# Patient Record
Sex: Female | Born: 2000 | Race: White | Hispanic: No | Marital: Single | Smoking: Never smoker
Health system: Southern US, Community
[De-identification: ages and names within clinical notes are randomized; demographics above are authoritative.]

## PROBLEM LIST (undated history)

## (undated) ENCOUNTER — Ambulatory Visit: Source: Home / Self Care

## (undated) ENCOUNTER — Ambulatory Visit: Admission: EM | Payer: Managed Care, Other (non HMO)

## (undated) DIAGNOSIS — Z23 Encounter for immunization: Secondary | ICD-10-CM

## (undated) DIAGNOSIS — Z889 Allergy status to unspecified drugs, medicaments and biological substances status: Secondary | ICD-10-CM

## (undated) DIAGNOSIS — R51 Headache: Secondary | ICD-10-CM

## (undated) DIAGNOSIS — S42309A Unspecified fracture of shaft of humerus, unspecified arm, initial encounter for closed fracture: Secondary | ICD-10-CM

## (undated) DIAGNOSIS — J45909 Unspecified asthma, uncomplicated: Secondary | ICD-10-CM

## (undated) DIAGNOSIS — R519 Headache, unspecified: Secondary | ICD-10-CM

## (undated) HISTORY — DX: Headache: R51

## (undated) HISTORY — DX: Headache, unspecified: R51.9

## (undated) HISTORY — DX: Encounter for immunization: Z23

## (undated) HISTORY — PX: UPPER GI ENDOSCOPY: SHX6162

---
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
## 2020-01-04 ENCOUNTER — Encounter: Payer: Self-pay | Admitting: Obstetrics and Gynecology

## 2020-03-12 ENCOUNTER — Ambulatory Visit (INDEPENDENT_AMBULATORY_CARE_PROVIDER_SITE_OTHER): Payer: Managed Care, Other (non HMO)

## 2020-03-12 ENCOUNTER — Other Ambulatory Visit: Payer: Self-pay

## 2020-03-12 DIAGNOSIS — Z3042 Encounter for surveillance of injectable contraceptive: Secondary | ICD-10-CM

## 2020-03-12 MED ORDER — MEDROXYPROGESTERONE ACETATE 150 MG/ML IM SUSP
150.0000 mg | Freq: Once | INTRAMUSCULAR | Status: AC
  Administered 2020-03-12: 150 mg via INTRAMUSCULAR

## 2020-03-12 NOTE — Progress Notes (Signed)
Pt here for depo which was given IM right glut.  NDC# [address]-2 

## 2020-06-04 ENCOUNTER — Other Ambulatory Visit: Payer: Self-pay

## 2020-06-04 ENCOUNTER — Ambulatory Visit (INDEPENDENT_AMBULATORY_CARE_PROVIDER_SITE_OTHER): Payer: Managed Care, Other (non HMO)

## 2020-06-04 DIAGNOSIS — Z3042 Encounter for surveillance of injectable contraceptive: Secondary | ICD-10-CM

## 2020-06-04 MED ORDER — MEDROXYPROGESTERONE ACETATE 150 MG/ML IM SUSP
150.0000 mg | Freq: Once | INTRAMUSCULAR | Status: AC
  Administered 2020-06-04: 150 mg via INTRAMUSCULAR

## 2020-06-04 NOTE — Progress Notes (Signed)
Pt here for depo which was given IM right glut.  NDC# [address]-2 

## 2020-06-12 ENCOUNTER — Ambulatory Visit: Payer: Managed Care, Other (non HMO) | Admitting: Obstetrics and Gynecology

## 2020-06-26 ENCOUNTER — Encounter: Payer: Self-pay | Admitting: Obstetrics and Gynecology

## 2020-06-26 ENCOUNTER — Ambulatory Visit (INDEPENDENT_AMBULATORY_CARE_PROVIDER_SITE_OTHER): Payer: Managed Care, Other (non HMO) | Admitting: Obstetrics and Gynecology

## 2020-06-26 ENCOUNTER — Other Ambulatory Visit (HOSPITAL_COMMUNITY)
Admission: RE | Admit: 2020-06-26 | Discharge: 2020-06-26 | Disposition: A | Discharge status: Home / Self Care | Payer: Managed Care, Other (non HMO) | Source: Ambulatory Visit | Attending: Obstetrics and Gynecology | Admitting: Obstetrics and Gynecology

## 2020-06-26 ENCOUNTER — Other Ambulatory Visit: Payer: Self-pay

## 2020-06-26 VITALS — BP 100/60 | Ht 60.0 in | Wt 118.0 lb

## 2020-06-26 DIAGNOSIS — N921 Excessive and frequent menstruation with irregular cycle: Secondary | ICD-10-CM | POA: Diagnosis not present

## 2020-06-26 DIAGNOSIS — Z113 Encounter for screening for infections with a predominantly sexual mode of transmission: Secondary | ICD-10-CM

## 2020-06-26 DIAGNOSIS — Z803 Family history of malignant neoplasm of breast: Secondary | ICD-10-CM

## 2020-06-26 DIAGNOSIS — Z3042 Encounter for surveillance of injectable contraceptive: Secondary | ICD-10-CM | POA: Diagnosis not present

## 2020-06-26 DIAGNOSIS — Z01419 Encounter for gynecological examination (general) (routine) without abnormal findings: Secondary | ICD-10-CM

## 2020-06-26 DIAGNOSIS — N941 Unspecified dyspareunia: Secondary | ICD-10-CM

## 2020-06-26 DIAGNOSIS — D229 Melanocytic nevi, unspecified: Secondary | ICD-10-CM

## 2020-06-26 MED ORDER — MEDROXYPROGESTERONE ACETATE 150 MG/ML IM SUSY: PREFILLED_SYRINGE | INTRAMUSCULAR | 0 refills | Status: DC

## 2020-06-26 NOTE — Progress Notes (Signed)
PCP:  Wayland Denis, PA-C   Chief Complaint  Patient presents with  . Gynecologic Exam    still having pain during intercourse     HPI:      [name] is a 19 y.o. G0P0000 who LMP was Patient's last menstrual period was 05/25/2020 (exact date)., presents today for her annual examination.  Her menses are irregular with depo, lasting a couple wks every month or so. Has mild dysmen with bleeding. Changed from OCPs 6/20 due to dyspareunia and pelvic pain. Didn't have cycle control with OCPs in past either. Dysmenorrhea mild, occurring first 1-2 days of flow. She does have intermenstrual bleeding.  Sex activity: single partner, contraception -depo. Dyspareunia (achy, sharp, crampy pains) not improved with depo; pain stops once sex is over. Had neg GYN u/s 1/21, question GI due to constipation at the time. Pt tried fiber, etc with decreased constipation, but still with dyspareunia. Sometimes has pain/pressure with BMs.   Last Pap: N/A Hx of STDs: none  There is a FH of breast cancer in her MGGM and  PGM in her 59s, per pt's mom. Details not known. Genetic testing not done. There is no FH of ovarian cancer. The patient does not do self-breast exams.  Tobacco use: The patient denies current or previous tobacco use. Alcohol use: none No drug use.  Exercise: min active  She does get adequate calcium and Vitamin D in her diet. Gardasil completed. Pt with mole on RT breast that changed about 2 yrs ago. No changes since dark spot in center appeared. Hasn't seen derm.   Past Medical History:  Diagnosis Date  . Broken arm   . Headache   . Vaccine for human papilloma virus (HPV) types 6, 11, 16, and 18 administered     Past Surgical History:  Procedure Laterality Date  . OTHER SURGICAL HISTORY Right 10/2011   Right thumb    Family History  Problem Relation Age of Onset  . Migraines Maternal Uncle        Had migraines as a teen  . ADD / ADHD Maternal Uncle   . Migraines  Maternal Grandmother   . Depression Maternal Grandmother   . Anxiety disorder Maternal Grandmother   . Diabetes Maternal Grandmother   . Hypertension Maternal Grandmother   . Breast cancer Paternal Grandmother        45s  . Breast cancer Other 60  . Stomach cancer Other   . Colon cancer Other     Social History   Socioeconomic History  . Marital status: Single    Spouse name: Not on file  . Number of children: Not on file  . Years of education: Not on file  . Highest education level: Not on file  Occupational History  . Not on file  Tobacco Use  . Smoking status: Never Smoker  . Smokeless tobacco: Never Used  Vaping Use  . Vaping Use: Never used  Substance and Sexual Activity  . Alcohol use: No  . Drug use: No  . Sexual activity: Yes    Birth control/protection: Injection  Other Topics Concern  . Not on file  Social History Narrative   Celenia is in ninth grade at [employer]. She is doing average this school year.   Living with her mother.   Social Determinants of Health   Financial Resource Strain:   . Difficulty of Paying Living Expenses:   Food Insecurity:   . Worried About [employer] of  Food in the Last Year:   . [place] in the Last Year:   Transportation Needs:   . Film/video editor (Medical):   Marland Kitchen Lack of Transportation (Non-Medical):   Physical Activity:   . Days of Exercise per Week:   . Minutes of Exercise per Session:   Stress:   . Feeling of Stress :   Social Connections:   . Frequency of Communication with Friends and Family:   . Frequency of Social Gatherings with Friends and Family:   . Attends Religious Services:   . Active Member of Clubs or Organizations:   . Attends Archivist Meetings:   Marland Kitchen Marital Status:   Intimate Partner Violence:   . Fear of Current or Ex-Partner:   . Emotionally Abused:   Marland Kitchen Physically Abused:   . Sexually Abused:     Outpatient Medications Prior to Visit  Medication  Sig Dispense Refill  . Calcium-Phosphorus-Vitamin D (CALCIUM/D3 ADULT GUMMIES) 200-96.6-200 MG-MG-UNIT CHEW Chew by mouth.    . cetirizine (ZYRTEC) 10 MG tablet Take 10 mg by mouth daily.    Marland Kitchen EPINEPHrine 0.3 mg/0.3 mL IJ SOAJ injection INJ 1 SYRINGE IM ONCE PRF ANAPHYLAXIS    . fexofenadine (ALLEGRA) 180 MG tablet TK 1 T PO QD  2  . fluticasone (FLONASE) 50 MCG/ACT nasal spray Place 2 sprays into both nostrils every morning.    . montelukast (SINGULAIR) 10 MG tablet     . medroxyPROGESTERone Acetate 150 MG/ML SUSY INJECT 1 ML IN THE MUSCLE ONCE FOR 1 DOSE 1 mL 2  . albuterol (VENTOLIN HFA) 108 (90 Base) MCG/ACT inhaler Inhale into the lungs.    . Multiple Vitamins-Minerals (MULTIVITAMIN PO) Take 1 Dose by mouth every morning.      No facility-administered medications prior to visit.      ROS:  Review of Systems  Constitutional: Negative for fatigue, fever and unexpected weight change.  Respiratory: Negative for cough, shortness of breath and wheezing.   Cardiovascular: Negative for chest pain, palpitations and leg swelling.  Gastrointestinal: Negative for blood in stool, constipation, diarrhea, nausea and vomiting.  Endocrine: Negative for cold intolerance, heat intolerance and polyuria.  Genitourinary: Positive for dyspareunia and menstrual problem. Negative for dysuria, flank pain, frequency, genital sores, hematuria, pelvic pain, urgency, vaginal bleeding, vaginal discharge and vaginal pain.  Musculoskeletal: Negative for back pain, joint swelling and myalgias.  Skin: Negative for rash.  Neurological: Negative for dizziness, syncope, light-headedness, numbness and headaches.  Hematological: Negative for adenopathy.  Psychiatric/Behavioral: Negative for agitation, confusion, sleep disturbance and suicidal ideas. The patient is not nervous/anxious.   BREAST: No symptoms   Objective: BP 100/60   Ht 5' (1.524 m)   Wt 118 lb (53.5 kg)   LMP 05/25/2020 (Exact Date)   BMI 23.05  kg/m    Physical Exam Constitutional:      Appearance: She is well-developed.  Genitourinary:     Vulva, vagina, cervix, uterus, right adnexa and left adnexa normal.     No vulval lesion or tenderness noted.     No vaginal discharge, erythema or tenderness.     No cervical polyp.     Uterus is not enlarged or tender.     No right or left adnexal mass present.     Right adnexa not tender.     Left adnexa not tender.  Neck:     Thyroid: No thyromegaly.  Cardiovascular:     Rate and Rhythm: Normal rate and regular rhythm.  Heart sounds: Normal heart sounds. No murmur heard.   Pulmonary:     Effort: Pulmonary effort is normal.     Breath sounds: Normal breath sounds.  Chest:     Breasts:        Right: No mass, nipple discharge, skin change or tenderness.        Left: No mass, nipple discharge, skin change or tenderness.    Abdominal:     Palpations: Abdomen is soft.     Tenderness: There is no abdominal tenderness. There is no guarding or rebound.  Musculoskeletal:        General: Normal range of motion.     Cervical back: Normal range of motion.  Neurological:     General: No focal deficit present.     Mental Status: She is alert and oriented to person, place, and time.     Cranial Nerves: No cranial nerve deficit.  Skin:    General: Skin is warm and dry.  Psychiatric:        Mood and Affect: Mood normal.        Behavior: Behavior normal.        Thought Content: Thought content normal.        Judgment: Judgment normal.  Vitals reviewed.     Assessment/Plan: Encounter for annual routine gynecological examination  Screening for STD (sexually transmitted disease) - Plan: Cervicovaginal ancillary only  Encounter for surveillance of injectable contraceptive - Plan: medroxyPROGESTERone Acetate 150 MG/ML SUSY; Depo RF until pt sees MD for sx/dx lap consult.   Breakthrough bleeding on depo provera--eval dyspareunia first and then determine tx. May change to  different tx.  Dyspareunia in female--no relief with OCPs/depo. Neg GYN u/s 1/21. Refer to MD for further eval, dx lap consult.   Atypical mole--RT breast. Pt to sched mole check with derm (doesn't need referral).   Family history of breast cancer--will discuss MyRisk testing age 71.   Meds ordered this encounter  Medications  . medroxyPROGESTERone Acetate 150 MG/ML SUSY    Sig: INJECT 1 ML IN THE MUSCLE ONCE FOR 1 DOSE    Dispense:  1 mL    Refill:  0    Order Specific Question:   Supervising Provider    Answer:   Gae Dry [[id number]]             GYN counsel  adequate intake of calcium and vitamin D, diet and exercise     F/U  Return in about 1 week (around 07/03/2020) for with MD for CPP/dx lap consult.  Jonathan Kirkendoll B. Neesa Knapik, PA-C 06/26/2020 4:07 PM

## 2020-06-26 NOTE — Patient Instructions (Signed)
I value your feedback and entrusting us with your care. If you get a [place] patient survey, I would appreciate you taking the time to let us know about your experience today. Thank you!  As of November 29, 2019, your lab results will be released to your MyChart immediately, before I even have a chance to see them. Please give me time to review them and contact you if there are any abnormalities. Thank you for your patience.  

## 2020-06-30 LAB — CERVICOVAGINAL ANCILLARY ONLY
Chlamydia: NEGATIVE
Comment: NEGATIVE
Comment: NORMAL
Neisseria Gonorrhea: NEGATIVE

## 2020-07-09 ENCOUNTER — Other Ambulatory Visit: Payer: Self-pay

## 2020-07-09 ENCOUNTER — Ambulatory Visit (INDEPENDENT_AMBULATORY_CARE_PROVIDER_SITE_OTHER): Payer: Managed Care, Other (non HMO) | Admitting: Obstetrics and Gynecology

## 2020-07-09 ENCOUNTER — Encounter: Payer: Self-pay | Admitting: Obstetrics and Gynecology

## 2020-07-09 VITALS — BP 120/80 | Ht 60.0 in | Wt 117.0 lb

## 2020-07-09 DIAGNOSIS — R102 Pelvic and perineal pain: Secondary | ICD-10-CM

## 2020-07-09 NOTE — Progress Notes (Signed)
Gynecology Pelvic Pain Evaluation   Chief Complaint:  Chief Complaint  Patient presents with  . Consult    History of Present Illness:   Patient is a 19 y.o. G0P0000 who LMP was Patient's last menstrual period was 05/25/2020., presents today for a problem visit.  She complains of pelvic pain and dysparunia.   Her pain is localized to the suprapubic area, described as intermittent, began several years ago and its severity is described as moderate. The pain radiates to the  Non-radiating. She has these associated symptoms which include bloating/abdominal distension and constipation. Context includes: during/after intercourse.  She has not noted any improvement in symptoms on prior trial of OCP's and now Depo Provera.  Previous evaluation: ultrasound showing normal gyn anatomy 12/27/2019.    Review of Systems: Review of Systems  Constitutional: Negative.   Gastrointestinal: Positive for abdominal pain and constipation. Negative for diarrhea, nausea and vomiting.  Genitourinary: Negative.     Past Medical History:  Patient Active Problem List   Diagnosis Date Noted  . Family history of breast cancer 06/26/2020  . Dyspareunia in female 06/26/2020  . LLQ pain 12/27/2019  . Episodic tension-type headache, not intractable 02/06/2015  . Tension type headache 05/14/2014  . Anxiety state, unspecified 05/14/2014    Past Surgical History:  Past Surgical History:  Procedure Laterality Date  . OTHER SURGICAL HISTORY Right 10/2011   Right thumb    Gynecologic History:  Patient's last menstrual period was 05/25/2020. She is has sex with males.  Contraception: Depo-Provera injections. Hx of STDs: none. History of domestic or sexual abuse:  no Last Pap: N/A under 21   Obstetric History: G0P0000  Family History:  Family History  Problem Relation Age of Onset  . Migraines Maternal Uncle        Had migraines as a teen  . ADD / ADHD Maternal Uncle   . Migraines Maternal  Grandmother   . Depression Maternal Grandmother   . Anxiety disorder Maternal Grandmother   . Diabetes Maternal Grandmother   . Hypertension Maternal Grandmother   . Breast cancer Paternal Grandmother        72s  . Breast cancer Other 60  . Stomach cancer Other   . Colon cancer Other     Social History:  Social History   Socioeconomic History  . Marital status: Single    Spouse name: Not on file  . Number of children: Not on file  . Years of education: Not on file  . Highest education level: Not on file  Occupational History  . Not on file  Tobacco Use  . Smoking status: Never Smoker  . Smokeless tobacco: Never Used  Vaping Use  . Vaping Use: Never used  Substance and Sexual Activity  . Alcohol use: No  . Drug use: No  . Sexual activity: Yes    Birth control/protection: Injection  Other Topics Concern  . Not on file  Social History Narrative   Harrietta is in ninth grade at [employer]. She is doing average this school year.   Living with her mother.   Social Determinants of Health   Financial Resource Strain:   . Difficulty of Paying Living Expenses:   Food Insecurity:   . Worried About Char[employer] fundraiser in the Last Year:   . Arboriculturist in the Last Year:   Transportation Needs:   . Film/video editor (Medical):   Marland Kitchen Lack of Transportation (Non-Medical):   Physical  Activity:   . Days of Exercise per Week:   . Minutes of Exercise per Session:   Stress:   . Feeling of Stress :   Social Connections:   . Frequency of Communication with Friends and Family:   . Frequency of Social Gatherings with Friends and Family:   . Attends Religious Services:   . Active Member of Clubs or Organizations:   . Attends Archivist Meetings:   Marland Kitchen Marital Status:   Intimate Partner Violence:   . Fear of Current or Ex-Partner:   . Emotionally Abused:   Marland Kitchen Physically Abused:   . Sexually Abused:     Allergies:  Allergies  Allergen Reactions    . Other     Seasonal Allergies    Medications: Prior to Admission medications   Medication Sig Start Date End Date Taking? Authorizing Provider  Calcium-Phosphorus-Vitamin D (CALCIUM/D3 ADULT GUMMIES) 200-96.6-200 MG-MG-UNIT CHEW Chew by mouth.   Yes [provider]  cetirizine (ZYRTEC) 10 MG tablet Take 10 mg by mouth daily.   Yes [provider]  EPINEPHrine 0.3 mg/0.3 mL IJ SOAJ injection INJ 1 SYRINGE IM ONCE PRF ANAPHYLAXIS 06/30/18  Yes [provider]  fexofenadine (ALLEGRA) 180 MG tablet TK 1 T PO QD 10/19/15  Yes [provider]  fluticasone (FLONASE) 50 MCG/ACT nasal spray Place 2 sprays into both nostrils every morning.   Yes [provider]  medroxyPROGESTERone Acetate 150 MG/ML SUSY INJECT 1 ML IN THE MUSCLE ONCE FOR 1 DOSE 02/24/17  Yes Copland, Alicia B, PA-C  montelukast (SINGULAIR) 10 MG tablet  04/30/19  Yes [provider]  albuterol (VENTOLIN HFA) 108 (90 Base) MCG/ACT inhaler Inhale into the lungs. 01/03/19 01/03/20  [provider]    Physical Exam Vitals: Blood pressure 120/80, height 5' (1.524 m), weight 117 lb (53.1 kg), last menstrual period 05/25/2020.  General: NAD, well nourished, appears stated age 89: normocephalic, anicteric Pulmonary: No increased work of breathing Extremities: no edema, erythema, or tenderness Neurologic: Grossly intact Psychiatric: mood appropriate, affect full  Female chaperone present for pelvic portion of the physical exam  Assessment: 19 y.o. G0P0000 with with chronic pelvic pain and dyspareunia   Problem List Items Addressed This Visit    None    Visit Diagnoses    Pelvic pain in female    -  Primary       1) We discussed the possible etiologies for pelvic pain in women.  Gynecologic causes may include endometriosis, adenomyosis, pelvic inflammatory disease (PID), ovarian cysts, ovarian or tubal torsion, and in rare case gynecologic malignancy such as cervical,  uterine, or ovarian cancer.  In addition thee possibility of non-gynecologic etiologies such as urinary or GI tract pathology or disordered, as well as musculoskeletal problems.  The goal is to complete a basic work up in hopes of identifying the underlying cause which in turn will dictate treatment.  In the meantime supportive measures such as localized heat, and NSAIDs are reasonable first steps.     - Prescription drug database was not reviewed, UDS was not ordered - Transvaginal ultrasound reviewed - Blood work obtained today No  - Cervical cultures reviewed negative 06/26/2020 - UA not ordered  2) Return if symptoms worsen or fail to improve.   Malachy Mood, MD, [facility], [address]

## 2020-07-16 ENCOUNTER — Telehealth: Payer: Self-pay | Admitting: Obstetrics and Gynecology

## 2020-07-16 NOTE — Telephone Encounter (Signed)
Called pt to schedule Diag Lap with [facility]  DOS 8/3  H&P 7/30 @ 8:10   Covid testing 7/30 @ 8-10:30 (after H&P), [address], drive up and wear mask. Advised pt to quarantine until DOS.  Pre-admit phone call appointment to be requested - date and time will be included on H&P paper work. Also all appointments will be updated on pt MyChart. Explained that this appointment has a call window. Based on the time scheduled will indicate if the call will be received within a 4 hour window before 1:00 or after.  Advised that pt may also receive calls from the hospital pharmacy and pre-service center.  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 No secondary insurance.  Pt wanted to have procedure done ASAP as she goes back to college 8/16.

## 2020-07-16 NOTE — Telephone Encounter (Signed)
-----   Message from Malachy Mood, MD sent at 07/14/2020 10:00 PM EDT ----- Regarding: Surgery Surgery Booking Request Patient Full Name:  Amy [name]  MRN: [id number]  DOB: 09-30-2001  Surgeon: Malachy Mood, MD  Requested Surgery Date and Time: 2-4 weeks Primary Diagnosis AND Code: Pelvic pain R10.2 Secondary Diagnosis and Code:  Surgical Procedure: Diagnostic Laparoscopy RNFA Requested?: No L&D Notification: No Admission Status: same day surgery Length of Surgery: 50 min Special Case Needs: No H&P: Yes Phone Interview???:  Yes Interpreter: No Medical Clearance:  No Special Scheduling Instructions: No Any known health/anesthesia issues, diabetes, sleep apnea, latex allergy, defibrillator/pacemaker?: No Acuity: P3   (P1 highest, P2 delay may cause harm, P3 low, elective gyn, P4 lowest)

## 2020-07-18 ENCOUNTER — Other Ambulatory Visit: Payer: Self-pay

## 2020-07-18 ENCOUNTER — Encounter: Payer: Self-pay | Admitting: Obstetrics and Gynecology

## 2020-07-18 ENCOUNTER — Ambulatory Visit (INDEPENDENT_AMBULATORY_CARE_PROVIDER_SITE_OTHER): Payer: Managed Care, Other (non HMO) | Admitting: Obstetrics and Gynecology

## 2020-07-18 ENCOUNTER — Encounter
Admission: RE | Admit: 2020-07-18 | Discharge: 2020-07-18 | Disposition: A | Discharge status: Home / Self Care | Payer: Managed Care, Other (non HMO) | Source: Ambulatory Visit | Attending: Obstetrics and Gynecology | Admitting: Obstetrics and Gynecology

## 2020-07-18 VITALS — BP 100/62 | HR 95 | Ht 60.0 in | Wt 118.0 lb

## 2020-07-18 DIAGNOSIS — Z01812 Encounter for preprocedural laboratory examination: Secondary | ICD-10-CM | POA: Diagnosis not present

## 2020-07-18 DIAGNOSIS — R102 Pelvic and perineal pain: Secondary | ICD-10-CM | POA: Diagnosis not present

## 2020-07-18 DIAGNOSIS — Z01818 Encounter for other preprocedural examination: Secondary | ICD-10-CM

## 2020-07-18 HISTORY — DX: Unspecified asthma, uncomplicated: J45.909

## 2020-07-18 NOTE — Patient Instructions (Signed)
Your procedure is scheduled on: Tuesday 07/22/20.  Report to DAY SURGERY DEPARTMENT LOCATED ON 2ND FLOOR MEDICAL MALL ENTRANCE. To find out your arrival time please call [phone] between 1PM - 3PM on Monday 07/21/20.   Remember: Instructions that are not followed completely may result in serious medical risk, up to and including death, or upon the discretion of your surgeon and anesthesiologist your surgery may need to be rescheduled.     __X__ 1. Do not eat food after midnight the night before your procedure.                 No gum chewing or hard candies. You may drink clear liquids up to 2 hours                 before you are scheduled to arrive for your surgery- DO NOT drink clear                 liquids within 2 hours of the start of your surgery.                 Clear Liquids include:  water, apple juice without pulp, clear carbohydrate                 drink such as Clearfast or Gatorade, Black Coffee or Tea (Do not add                 milk or creamer to coffee or tea).   __X__2.  On the morning of surgery brush your teeth with toothpaste and water, you may rinse your mouth with mouthwash if you wish.  Do not swallow any toothpaste or mouthwash.    __X__ 3.  No Alcohol for 24 hours before or after surgery.  __X__ 4.  Do Not Smoke or use e-cigarettes For 24 Hours Prior to Your Surgery.                 Do not use any chewable tobacco products for at least 6 hours prior to                 surgery.  __X__5.  Notify your doctor if there is any change in your medical condition      (cold, fever, infections).      Do NOT wear jewelry, make-up, hairpins, clips or nail polish. Do NOT wear lotions, powders, or perfumes.  Do NOT shave 48 hours prior to surgery. Men may shave face and neck. Do NOT bring valuables to the hospital.     [facility] is not responsible for any belongings or valuables.   Contacts, dentures/partials or body piercings may not be worn into surgery. Bring a  case for your contacts, glasses or hearing aids, a denture cup will be supplied.    Patients discharged the day of surgery will not be allowed to drive home.     __X__ Take these medicines the morning of surgery with A SIP OF WATER:     1. albuterol (VENTOLIN HFA)   2. cetirizine (ZYRTEC)   3. fluticasone (FLONASE)  4. montelukast (SINGULAIR)     __X__ Use CHG Soap as directed   __X__ Use inhalers on the day of surgery. Also bring the inhaler with you to the hospital on the morning of surgery.   __X__ Stop Anti-inflammatories 7 days before surgery such as Advil, Ibuprofen, Motrin, BC or Goodies Powder, Naprosyn, Naproxen, Aleve, Aspirin, Meloxicam. May take Tylenol if needed for pain or discomfort.  __X__Do not start taking any new herbal supplements or vitamins prior to your procedure.     Wear comfortable clothing (specific to your surgery type) to the hospital.  Plan for stool softeners for home use; pain medications have a tendency to cause constipation. You can also help prevent constipation by eating foods high in fiber such as fruits and vegetables and drinking plenty of fluids as your diet allows.  After surgery, you can prevent lung complications by doing breathing exercises.Take deep breaths and cough every 1-2 hours. Your doctor may order a device called an Incentive Spirometer to help you take deep breaths.  Please call the [place] Department at [phone] if you have any questions about these instructions

## 2020-07-18 NOTE — Progress Notes (Signed)
Obstetrics & Gynecology Surgery H&P    Chief Complaint: Scheduled Surgery   History of Present Illness: Patient is a 19 y.o. [name] presenting for scheduled diagnostic laparoscopy, for the treatment or further evaluation of pelvic pain.   Prior Treatments prior to proceeding with surgery include: depo provera as well as OCP  Preoperative Pap: N/A under 21 Preoperative Endometrial biopsy: N/A Preoperative Ultrasound: 12/27/2019 normal TVUS   Review of Systems:10 point review of systems  Past Medical History:  Patient Active Problem List   Diagnosis Date Noted   Family history of breast cancer 06/26/2020   Dyspareunia in female 06/26/2020   LLQ pain 12/27/2019   Episodic tension-type headache, not intractable 02/06/2015   Tension type headache 05/14/2014   Anxiety state, unspecified 05/14/2014    Past Surgical History:  Past Surgical History:  Procedure Laterality Date   OTHER SURGICAL HISTORY Right 10/2011   Right thumb    Family History:  Family History  Problem Relation Age of Onset   Migraines Maternal Uncle        Had migraines as a teen   ADD / ADHD Maternal Uncle    Migraines Maternal Grandmother    Depression Maternal Grandmother    Anxiety disorder Maternal Grandmother    Diabetes Maternal Grandmother    Hypertension Maternal Grandmother    Breast cancer Paternal Grandmother        67s   Breast cancer Other 36   Stomach cancer Other    Colon cancer Other     Social History:  Social History   Socioeconomic History   Marital status: Single    Spouse name: Not on file   Number of children: Not on file   Years of education: Not on file   Highest education level: Not on file  Occupational History   Not on file  Tobacco Use   Smoking status: Never Smoker   Smokeless tobacco: Never Used  Vaping Use   Vaping Use: Never used  Substance and Sexual Activity   Alcohol use: No   Drug use: No   Sexual activity: Yes     Birth control/protection: Injection  Other Topics Concern   Not on file  Social History Narrative   Amy [name] is in ninth grade at [employer]. She is doing average this school year.   Living with her mother.   Social Determinants of Health   Financial Resource Strain:    Difficulty of Paying Living Expenses:   Food Insecurity:    Worried About Charity fundraiser in the Last Year:    Arboriculturist in the Last Year:   Transportation Needs:    Film/video editor (Medical):    Lack of Transportation (Non-Medical):   Physical Activity:    Days of Exercise per Week:    Minutes of Exercise per Session:   Stress:    Feeling of Stress :   Social Connections:    Frequency of Communication with Friends and Family:    Frequency of Social Gatherings with Friends and Family:    Attends Religious Services:    Active Member of Clubs or Organizations:    Attends Archivist Meetings:    Marital Status:   Intimate Partner Violence:    Fear of Current or Ex-Partner:    Emotionally Abused:    Physically Abused:    Sexually Abused:     Allergies:  Allergies  Allergen Reactions   Fruit & Vegetable Daily [Nutritional Supplements] Itching  All fruit   Other     Seasonal Allergies    Medications: Prior to Admission medications   Medication Sig Start Date End Date Taking? Authorizing Provider  Calcium-Phosphorus-Vitamin D (CALCIUM/D3 ADULT GUMMIES) 200-96.6-200 MG-MG-UNIT CHEW Chew 1 tablet by mouth daily.    Yes [provider]  cetirizine (ZYRTEC) 10 MG tablet Take 10 mg by mouth daily.   Yes [provider]  EPINEPHrine 0.3 mg/0.3 mL IJ SOAJ injection Inject 0.3 mg into the muscle as needed for anaphylaxis.  06/30/18  Yes [provider]  fluticasone (FLONASE) 50 MCG/ACT nasal spray Place 2 sprays into both nostrils daily.    Yes [provider]  medroxyPROGESTERone Acetate 150 MG/ML SUSY INJECT 1  ML IN THE MUSCLE ONCE FOR 1 DOSE Patient taking differently: Inject 150 mg into the skin every 3 (three) months.  03/26/81  Yes Copland, Elmo Putt B, PA-C  montelukast (SINGULAIR) 10 MG tablet Take 10 mg by mouth daily.  04/30/19  Yes [provider]  albuterol (VENTOLIN HFA) 108 (90 Base) MCG/ACT inhaler Inhale 2 puffs into the lungs every 4 (four) hours as needed for wheezing or shortness of breath.  01/03/19 07/16/20  [provider]    Physical Exam Vitals: Blood pressure (!) 100/62, pulse 95, height 5' (1.524 m), weight 118 lb (53.5 kg).  General: NAD HEENT: normocephalic, anicteric Pulmonary: No increased work of breathing, CTAB Cardiovascular: RRR, distal pulses 2+ Abdomen: soft, non-tender, non-distended Genitourinary: deferred Extremities: no edema, erythema, or tenderness Neurologic: Grossly intact Psychiatric: mood appropriate, affect full  Imaging No results found.  Assessment: Amy [name] presenting for scheduled diagnostic laparoscopy  Plan: 1) I have had a careful discussion with this patient about all the options available and the risk/benefits of each. I have fully informed this patient that a laparoscopy may subject her to a variety of discomforts and risks: She understands that most patients have surgery with little difficulty, but problems can happen ranging from minor to fatal. These include nausea, vomiting, pain, bleeding, infection, poor healing, hernia, or formation of adhesions. Unexpected reactions may occur from any drug or anesthetic given. Unintended injury may occur to other pelvic or abdominal structures such as Fallopian tubes, ovaries, bladder, ureter (tube from kidney to bladder), or bowel. Nerves going from the pelvis to the legs may be injured. Any such injury may require immediate or later additional surgery to correct the problem. Excessive blood loss requiring transfusion is very unlikely but possible. Dangerous blood clots may form in  the legs or lungs. Physical and sexual activity will be restricted in varying degrees for an indeterminate period of time but most often 2-4 weeks. She understands that the plan is to do this laparoscopically, however, there is a chance that this will need to be performed via a larger incision. Finally, she understands that it is impossible to list every possible undesirable effect and that the condition for which surgery is done is not always cured or significantly improved, and in rare cases may be even worsen. Ample time was given to answer all questions.   2) Routine postoperative instructions were reviewed with the patient and her family in detail today including the expected length of recovery and likely postoperative course.  The patient concurred with the proposed plan, giving informed written consent for the surgery today.  Patient instructed on the importance of being NPO after midnight prior to her procedure.  If warranted preoperative prophylactic antibiotics and SCDs ordered on call to the OR to  meet SCIP guidelines and adhere to recommendation laid forth in [place] Number 104 May 2009  "Antibiotic Prophylaxis for Gynecologic Procedures".     Malachy Mood, MD, [facility], [facility] 07/18/2020, 8:37 AM

## 2020-07-21 ENCOUNTER — Other Ambulatory Visit: Payer: Self-pay

## 2020-07-21 ENCOUNTER — Other Ambulatory Visit
Admission: RE | Admit: 2020-07-21 | Discharge: 2020-07-21 | Disposition: A | Discharge status: Home / Self Care | Payer: Managed Care, Other (non HMO) | Source: Ambulatory Visit | Attending: Obstetrics and Gynecology | Admitting: Obstetrics and Gynecology

## 2020-07-21 DIAGNOSIS — Z01812 Encounter for preprocedural laboratory examination: Secondary | ICD-10-CM | POA: Diagnosis not present

## 2020-07-21 DIAGNOSIS — Z20822 Contact with and (suspected) exposure to covid-19: Secondary | ICD-10-CM | POA: Insufficient documentation

## 2020-07-21 LAB — SARS CORONAVIRUS 2 (TAT 6-24 HRS): SARS Coronavirus 2: NEGATIVE

## 2020-07-21 MED ORDER — LACTATED RINGERS IV SOLN: INTRAVENOUS | Status: DC

## 2020-07-21 MED ORDER — ORAL CARE MOUTH RINSE: 15.0000 mL | Freq: Once | OROMUCOSAL | Status: AC

## 2020-07-21 MED ORDER — FAMOTIDINE 20 MG PO TABS: 20.0000 mg | ORAL_TABLET | Freq: Once | ORAL | Status: AC

## 2020-07-21 MED ORDER — CHLORHEXIDINE GLUCONATE 0.12 % MT SOLN: 15.0000 mL | Freq: Once | OROMUCOSAL | Status: AC

## 2020-07-22 ENCOUNTER — Encounter: Payer: Self-pay | Admitting: Obstetrics and Gynecology

## 2020-07-22 ENCOUNTER — Encounter
Admission: RE | Disposition: A | Discharge status: Home / Self Care | Payer: Self-pay | Source: Home / Self Care | Attending: Obstetrics and Gynecology

## 2020-07-22 ENCOUNTER — Ambulatory Visit
Admission: RE | Admit: 2020-07-22 | Discharge: 2020-07-22 | Disposition: A | Discharge status: Home / Self Care | Payer: Managed Care, Other (non HMO) | Attending: Obstetrics and Gynecology | Admitting: Obstetrics and Gynecology

## 2020-07-22 ENCOUNTER — Ambulatory Visit: Payer: Managed Care, Other (non HMO) | Admitting: Anesthesiology

## 2020-07-22 ENCOUNTER — Other Ambulatory Visit: Payer: Self-pay

## 2020-07-22 DIAGNOSIS — R102 Pelvic and perineal pain: Secondary | ICD-10-CM | POA: Diagnosis not present

## 2020-07-22 DIAGNOSIS — Z79899 Other long term (current) drug therapy: Secondary | ICD-10-CM | POA: Insufficient documentation

## 2020-07-22 DIAGNOSIS — G8929 Other chronic pain: Secondary | ICD-10-CM | POA: Diagnosis not present

## 2020-07-22 DIAGNOSIS — N803 Endometriosis of pelvic peritoneum: Secondary | ICD-10-CM | POA: Diagnosis not present

## 2020-07-22 DIAGNOSIS — J45909 Unspecified asthma, uncomplicated: Secondary | ICD-10-CM | POA: Diagnosis not present

## 2020-07-22 DIAGNOSIS — J4520 Mild intermittent asthma, uncomplicated: Secondary | ICD-10-CM | POA: Diagnosis not present

## 2020-07-22 DIAGNOSIS — Z9889 Other specified postprocedural states: Secondary | ICD-10-CM

## 2020-07-22 HISTORY — PX: LAPAROSCOPY: SHX197

## 2020-07-22 LAB — POCT PREGNANCY, URINE: Preg Test, Ur: NEGATIVE

## 2020-07-22 SURGERY — LAPAROSCOPY, DIAGNOSTIC
Anesthesia: General

## 2020-07-22 MED ORDER — ESMOLOL HCL 100 MG/10ML IV SOLN
INTRAVENOUS | Status: AC
  Filled 2020-07-22: qty 10

## 2020-07-22 MED ORDER — OXYCODONE HCL 5 MG PO TABS: 5.0000 mg | ORAL_TABLET | Freq: Once | ORAL | Status: DC | PRN

## 2020-07-22 MED ORDER — ACETAMINOPHEN 10 MG/ML IV SOLN
INTRAVENOUS | Status: AC
  Filled 2020-07-22: qty 100

## 2020-07-22 MED ORDER — DEXAMETHASONE SODIUM PHOSPHATE 10 MG/ML IJ SOLN
INTRAMUSCULAR | Status: AC
  Filled 2020-07-22: qty 1

## 2020-07-22 MED ORDER — PROMETHAZINE HCL 25 MG/ML IJ SOLN: 6.2500 mg | INTRAMUSCULAR | Status: DC | PRN

## 2020-07-22 MED ORDER — FENTANYL CITRATE (PF) 100 MCG/2ML IJ SOLN
INTRAMUSCULAR | Status: DC | PRN
  Administered 2020-07-22 (×2): 50 ug via INTRAVENOUS

## 2020-07-22 MED ORDER — ONDANSETRON HCL 4 MG/2ML IJ SOLN
INTRAMUSCULAR | Status: AC
  Filled 2020-07-22: qty 2

## 2020-07-22 MED ORDER — FENTANYL CITRATE (PF) 100 MCG/2ML IJ SOLN: 25.0000 ug | INTRAMUSCULAR | Status: DC | PRN

## 2020-07-22 MED ORDER — BUPIVACAINE HCL 0.5 % IJ SOLN
INTRAMUSCULAR | Status: DC | PRN
  Administered 2020-07-22: 10 mL

## 2020-07-22 MED ORDER — IBUPROFEN 600 MG PO TABS
600.0000 mg | ORAL_TABLET | Freq: Four times a day (QID) | ORAL | 0 refills | Status: DC | PRN
Start: 2020-07-22 — End: 2020-07-29

## 2020-07-22 MED ORDER — FENTANYL CITRATE (PF) 100 MCG/2ML IJ SOLN
INTRAMUSCULAR | Status: AC
  Filled 2020-07-22: qty 2

## 2020-07-22 MED ORDER — SUGAMMADEX SODIUM 200 MG/2ML IV SOLN
INTRAVENOUS | Status: DC | PRN
  Administered 2020-07-22: 200 mg via INTRAVENOUS

## 2020-07-22 MED ORDER — OXYCODONE-ACETAMINOPHEN 5-325 MG PO TABS: 1.0000 | ORAL_TABLET | ORAL | 0 refills | Status: DC | PRN

## 2020-07-22 MED ORDER — DEXMEDETOMIDINE HCL IN NACL 80 MCG/20ML IV SOLN
INTRAVENOUS | Status: AC
  Filled 2020-07-22: qty 20

## 2020-07-22 MED ORDER — FAMOTIDINE 20 MG PO TABS
ORAL_TABLET | ORAL | Status: AC
  Administered 2020-07-22: 20 mg via ORAL
  Filled 2020-07-22: qty 1

## 2020-07-22 MED ORDER — MIDAZOLAM HCL 2 MG/2ML IJ SOLN
INTRAMUSCULAR | Status: AC
  Filled 2020-07-22: qty 2

## 2020-07-22 MED ORDER — LIDOCAINE HCL (PF) 2 % IJ SOLN
INTRAMUSCULAR | Status: AC
  Filled 2020-07-22: qty 5

## 2020-07-22 MED ORDER — MIDAZOLAM HCL 2 MG/2ML IJ SOLN
INTRAMUSCULAR | Status: DC | PRN
  Administered 2020-07-22: 2 mg via INTRAVENOUS

## 2020-07-22 MED ORDER — ACETAMINOPHEN 10 MG/ML IV SOLN
INTRAVENOUS | Status: DC | PRN
  Administered 2020-07-22: 1000 mg via INTRAVENOUS

## 2020-07-22 MED ORDER — EPHEDRINE 5 MG/ML INJ
INTRAVENOUS | Status: AC
  Filled 2020-07-22: qty 10

## 2020-07-22 MED ORDER — PROPOFOL 10 MG/ML IV BOLUS
INTRAVENOUS | Status: AC
  Filled 2020-07-22: qty 20

## 2020-07-22 MED ORDER — ROCURONIUM BROMIDE 10 MG/ML (PF) SYRINGE
PREFILLED_SYRINGE | INTRAVENOUS | Status: AC
  Filled 2020-07-22: qty 10

## 2020-07-22 MED ORDER — PROPOFOL 10 MG/ML IV BOLUS
INTRAVENOUS | Status: DC | PRN
  Administered 2020-07-22: 120 mg via INTRAVENOUS

## 2020-07-22 MED ORDER — LIDOCAINE HCL (CARDIAC) PF 100 MG/5ML IV SOSY
PREFILLED_SYRINGE | INTRAVENOUS | Status: DC | PRN
  Administered 2020-07-22: 50 mg via INTRAVENOUS

## 2020-07-22 MED ORDER — ONDANSETRON HCL 4 MG/2ML IJ SOLN
INTRAMUSCULAR | Status: DC | PRN
  Administered 2020-07-22: 4 mg via INTRAVENOUS

## 2020-07-22 MED ORDER — DEXAMETHASONE SODIUM PHOSPHATE 10 MG/ML IJ SOLN
INTRAMUSCULAR | Status: DC | PRN
  Administered 2020-07-22: 10 mg via INTRAVENOUS

## 2020-07-22 MED ORDER — ROCURONIUM BROMIDE 100 MG/10ML IV SOLN
INTRAVENOUS | Status: DC | PRN
  Administered 2020-07-22: 30 mg via INTRAVENOUS

## 2020-07-22 MED ORDER — CHLORHEXIDINE GLUCONATE 0.12 % MT SOLN
OROMUCOSAL | Status: AC
  Administered 2020-07-22: 15 mL via OROMUCOSAL
  Filled 2020-07-22: qty 15

## 2020-07-22 MED ORDER — OXYCODONE HCL 5 MG/5ML PO SOLN: 5.0000 mg | Freq: Once | ORAL | Status: DC | PRN

## 2020-07-22 MED ORDER — EPHEDRINE SULFATE 50 MG/ML IJ SOLN
INTRAMUSCULAR | Status: DC | PRN
  Administered 2020-07-22 (×2): 5 mg via INTRAVENOUS

## 2020-07-22 MED ORDER — BUPIVACAINE HCL (PF) 0.5 % IJ SOLN
INTRAMUSCULAR | Status: AC
  Filled 2020-07-22: qty 30

## 2020-07-22 MED ORDER — DEXMEDETOMIDINE HCL 200 MCG/2ML IV SOLN
INTRAVENOUS | Status: DC | PRN
  Administered 2020-07-22 (×3): 4 ug via INTRAVENOUS

## 2020-07-22 MED ORDER — GLYCOPYRROLATE 0.2 MG/ML IJ SOLN
INTRAMUSCULAR | Status: AC
  Filled 2020-07-22: qty 1

## 2020-07-22 MED ORDER — MEPERIDINE HCL 50 MG/ML IJ SOLN: 6.2500 mg | INTRAMUSCULAR | Status: DC | PRN

## 2020-07-22 SURGICAL SUPPLY — 35 items
ADH SKN CLS APL DERMABOND .7 (GAUZE/BANDAGES/DRESSINGS) ×1
APL PRP STRL LF DISP 70% ISPRP (MISCELLANEOUS) ×1
BLADE SURG SZ11 CARB STEEL (BLADE) ×2 IMPLANT
CANISTER SUCT 1200ML W/VALVE (MISCELLANEOUS) ×2 IMPLANT
CATH ROBINSON RED A/P 16FR (CATHETERS) ×2 IMPLANT
CHLORAPREP W/TINT 26 (MISCELLANEOUS) ×2 IMPLANT
COVER WAND RF STERILE (DRAPES) ×4 IMPLANT
DERMABOND ADVANCED (GAUZE/BANDAGES/DRESSINGS) ×1
DERMABOND ADVANCED .7 DNX12 (GAUZE/BANDAGES/DRESSINGS) ×1 IMPLANT
DRSG TEGADERM 2-3/8X2-3/4 SM (GAUZE/BANDAGES/DRESSINGS) ×2 IMPLANT
GLOVE BIO SURGEON STRL SZ7 (GLOVE) ×2 IMPLANT
GLOVE INDICATOR 7.5 STRL GRN (GLOVE) ×2 IMPLANT
GOWN STRL REUS W/ TWL LRG LVL3 (GOWN DISPOSABLE) ×2 IMPLANT
GOWN STRL REUS W/TWL LRG LVL3 (GOWN DISPOSABLE) ×4
IRRIGATION STRYKERFLOW (MISCELLANEOUS) IMPLANT
IRRIGATOR STRYKERFLOW (MISCELLANEOUS)
IV LACTATED RINGERS 1000ML (IV SOLUTION) IMPLANT
KIT PINK PAD W/HEAD ARE REST (MISCELLANEOUS)
KIT PINK PAD W/HEAD ARM REST (MISCELLANEOUS) IMPLANT
KIT TURNOVER CYSTO (KITS) ×2 IMPLANT
LABEL OR SOLS (LABEL) ×2 IMPLANT
NS IRRIG 500ML POUR BTL (IV SOLUTION) ×2 IMPLANT
PACK GYN LAPAROSCOPIC (MISCELLANEOUS) ×2 IMPLANT
PAD OB MATERNITY 4.3X12.25 (PERSONAL CARE ITEMS) ×2 IMPLANT
PAD PREP 24X41 OB/GYN DISP (PERSONAL CARE ITEMS) ×2 IMPLANT
SCISSORS METZENBAUM CVD 33 (INSTRUMENTS) IMPLANT
SET TUBE SMOKE EVAC HIGH FLOW (TUBING) ×2 IMPLANT
SHEARS HARMONIC ACE PLUS 36CM (ENDOMECHANICALS) IMPLANT
SLEEVE ENDOPATH XCEL 5M (ENDOMECHANICALS) ×2 IMPLANT
SPONGE GAUZE 2X2 8PLY STRL LF (GAUZE/BANDAGES/DRESSINGS) ×2 IMPLANT
SUT MNCRL AB 4-0 PS2 18 (SUTURE) IMPLANT
SUT VIC AB 0 CT2 27 (SUTURE) ×2 IMPLANT
SYR 10ML LL (SYRINGE) ×2 IMPLANT
TROCAR ENDO BLADELESS 11MM (ENDOMECHANICALS) IMPLANT
TROCAR XCEL NON-BLD 5MMX100MML (ENDOMECHANICALS) ×2 IMPLANT

## 2020-07-22 NOTE — Op Note (Signed)
Preoperative Diagnosis: 1) 19 y.o. with chronic pelvic pain  Postoperative Diagnosis: 1) 19 y.o. with chronic pelvic pain  Operation Performed: Diagnostic laparoscopy, right ovarian fossa peritoneal biopsies  Indication: 19 y.o. G0P0000  with chronic pelvic pain   Surgeon: Malachy Mood, MD  Anesthesia: General  Preoperative Antibiotics: none  Estimated Blood Loss: 5 mL  IV Fluids: 97mL  Urine Output:: 522mL  Drains or Tubes: none  Implants: none  Specimens Removed: right ovarian fossa peritoneal biopsies  Complications: none  Intraoperative Findings: Normal tubes, ovaries, and uterus.  There was evidence of small clear vesicles studding the right ovarian fossa    Patient Condition: stable  Procedure in Detail:  Patient was taken to the operating room where she was administered general anesthesia.  She was positioned in the dorsal lithotomy position utilizing Allen stirups, prepped and draped in the usual sterile fashion.  Prior to proceeding with procedure a time out was performed.  Attention was turned to the patient's pelvis.  A red rubber catheter was used to empty the patient's bladder.  An operative speculum was placed to allow visualization of the cervix.  The anterior lip of the cervix was grasped with a single tooth tenaculum, and a Hulka tenaculum was placed to allow manipulation of the uterus.  The operative speculum and single tooth tenaculum were then removed.  Attention was turned to the patient's abdomen.  The umbilicus was infiltrated with 1% Sensorcaine, before making a stab incision using an 11 blade scalpel.  A 58mm Excel trocar was then used to gain direct entry into the peritoneal cavity utilizing the camera to visualize progress of the trocar during placement.  Once peritoneal entry had been achieved, insufflation was started and pneumoperitoneum established at a pressure of 41mmHg. One additional 29mm excel trocars were placed under direct visualiztion in  the suprapubic region. General inspection of the abdomen revealed the above noted findings.  An atraumatic grasper was used to obtain a biopsy of the vesicular lesions in the right ovarian fossa.   Pneumoperitoneum was evacuated.  The trocars were removed.  All trocar site were closed with 4-0 Monocryl in a subcuticular fashion.  All trocar sites were then dressed with surgical skin glue.  The Hulka tenaculum was removed.  Sponge needle and instrument counts were correct time two.  The patient tolerated the procedure well and was taken to the recovery room in stable condition.

## 2020-07-22 NOTE — Progress Notes (Addendum)
Date of Initial H&P: 07/18/2020  History reviewed, patient examined, no change in status, stable for surgery.

## 2020-07-22 NOTE — Anesthesia Postprocedure Evaluation (Signed)
Anesthesia Post Note  Patient: Amy [name]  Procedure(s) Performed: LAPAROSCOPY DIAGNOSTIC (N/A )  Patient location during evaluation: PACU Anesthesia Type: General Level of consciousness: awake and alert Pain management: pain level controlled Vital Signs Assessment: post-procedure vital signs reviewed and stable Respiratory status: spontaneous breathing, nonlabored ventilation and respiratory function stable Cardiovascular status: blood pressure returned to baseline and stable Postop Assessment: no apparent nausea or vomiting Anesthetic complications: no   No complications documented.   Last Vitals:  Vitals:   07/22/20 1950 07/22/20 1955  BP:  105/65  Pulse: 73   Resp: 16   Temp:    SpO2: 100%     Last Pain:  Vitals:   07/22/20 1950  TempSrc:   PainSc: 0-No pain                 Brett Canales Yaritzel Stange

## 2020-07-22 NOTE — Discharge Instructions (Signed)

## 2020-07-22 NOTE — Anesthesia Preprocedure Evaluation (Signed)
Anesthesia Evaluation  Patient identified by MRN, date of birth, ID band Patient awake    Reviewed: Allergy & Precautions, NPO status , Patient's Chart, lab work & pertinent test results  History of Anesthesia Complications Negative for: history of anesthetic complications  Airway Mallampati: II  TM Distance: >3 FB Neck ROM: Full    Dental no notable dental hx.    Pulmonary asthma (mild intermittent) , neg sleep apnea,    breath sounds clear to auscultation- rhonchi (-) wheezing      Cardiovascular Exercise Tolerance: Good (-) hypertension(-) CAD, (-) Past MI, (-) Cardiac Stents and (-) CABG  Rhythm:Regular Rate:Normal - Systolic murmurs and - Diastolic murmurs    Neuro/Psych  Headaches, neg Seizures Anxiety    GI/Hepatic negative GI ROS, Neg liver ROS,   Endo/Other  negative endocrine ROSneg diabetes  Renal/GU negative Renal ROS     Musculoskeletal negative musculoskeletal ROS (+)   Abdominal (+) - obese,   Peds  Hematology negative hematology ROS (+)   Anesthesia Other Findings Past Medical History: No date: Asthma No date: Broken arm No date: Headache No date: Vaccine for human papilloma virus (HPV) types 6, 11, 16, and  18 administered   Reproductive/Obstetrics                             Anesthesia Physical Anesthesia Plan  ASA: II  Anesthesia Plan: General   Post-op Pain Management:    Induction: Intravenous  PONV Risk Score and Plan: 2 and Ondansetron, Dexamethasone and Midazolam  Airway Management Planned: Oral ETT  Additional Equipment:   Intra-op Plan:   Post-operative Plan: Extubation in OR  Informed Consent: I have reviewed the patients History and Physical, chart, labs and discussed the procedure including the risks, benefits and alternatives for the proposed anesthesia with the patient or authorized representative who has indicated his/her understanding and  acceptance.     Dental advisory given  Plan Discussed with: CRNA and Anesthesiologist  Anesthesia Plan Comments:         Anesthesia Quick Evaluation

## 2020-07-22 NOTE — Anesthesia Procedure Notes (Signed)
Procedure Name: Intubation Date/Time: 07/22/2020 5:59 PM Performed by: Caryl Asp, CRNA Pre-anesthesia Checklist: Patient identified, Patient being monitored, Timeout performed, Emergency Drugs available and Suction available Patient Re-evaluated:Patient Re-evaluated prior to induction Oxygen Delivery Method: Circle system utilized Preoxygenation: Pre-oxygenation with 100% oxygen Induction Type: IV induction Ventilation: Mask ventilation without difficulty Laryngoscope Size: 3 and McGraph Grade View: Grade I Tube type: Oral Tube size: 6.5 mm Number of attempts: 1 Airway Equipment and Method: Stylet Placement Confirmation: ETT inserted through vocal cords under direct vision,  positive ETCO2 and breath sounds checked- equal and bilateral Secured at: 20 cm Tube secured with: Tape Dental Injury: Teeth and Oropharynx as per pre-operative assessment

## 2020-07-22 NOTE — Transfer of Care (Signed)
Immediate Anesthesia Transfer of Care Note  Patient: Amy [name]  Procedure(s) Performed: LAPAROSCOPY DIAGNOSTIC (N/A )  Patient Location: PACU  Anesthesia Type:General  Level of Consciousness: drowsy  Airway & Oxygen Therapy: Patient Spontanous Breathing and Patient connected to face mask oxygen  Post-op Assessment: Report given to RN and Post -op Vital signs reviewed and stable  Post vital signs: Reviewed and stable  Last Vitals:  Vitals Value Taken Time  BP 116/70 07/22/20 1902  Temp 36.1 C 07/22/20 1902  Pulse 79 07/22/20 1906  Resp 17 07/22/20 1906  SpO2 100 % 07/22/20 1906  Vitals shown include unvalidated device data.  Last Pain:  Vitals:   07/22/20 1208  TempSrc: Oral  PainSc: 0-No pain         Complications: No complications documented.

## 2020-07-23 ENCOUNTER — Encounter: Payer: Self-pay | Admitting: Obstetrics and Gynecology

## 2020-07-24 LAB — SURGICAL PATHOLOGY

## 2020-07-24 NOTE — H&P (Signed)
Date of Initial H&P: 07/18/2020  History reviewed, patient examined, no change in status, stable for surgery.

## 2020-07-29 ENCOUNTER — Ambulatory Visit (INDEPENDENT_AMBULATORY_CARE_PROVIDER_SITE_OTHER): Payer: Managed Care, Other (non HMO) | Admitting: Obstetrics and Gynecology

## 2020-07-29 ENCOUNTER — Encounter: Payer: Self-pay | Admitting: Obstetrics and Gynecology

## 2020-07-29 ENCOUNTER — Other Ambulatory Visit: Payer: Self-pay

## 2020-07-29 VITALS — BP 110/66 | Wt 120.0 lb

## 2020-07-29 DIAGNOSIS — Z4889 Encounter for other specified surgical aftercare: Secondary | ICD-10-CM

## 2020-07-29 MED ORDER — ORILISSA 150 MG PO TABS: 1.0000 | ORAL_TABLET | Freq: Every day | ORAL | 5 refills | Status: DC

## 2020-07-29 NOTE — Progress Notes (Signed)
      Postoperative Follow-up Patient presents post op from diagnostic laparoscopy 1weeks ago for pelvic pain.  Subjective: Patient reports marked improvement in her preop symptoms. Eating a regular diet without difficulty. The patient is not having any pain.  Activity: normal activities of daily living.  Objective: Blood pressure 110/66, weight 120 lb (54.4 kg).  General: NAD Pulmonary: no increased work of breathing Abdomen: soft, non-tender, non-distended, incision(s) D/C/I Extremities: no edema Neurologic: normal gait    Admission on 07/22/2020, Discharged on 07/22/2020  Component Date Value Ref Range Status  . Preg Test, Ur 07/22/2020 NEGATIVE  NEGATIVE Final   Comment:        THE SENSITIVITY OF THIS METHODOLOGY IS >24 mIU/mL   . SURGICAL PATHOLOGY 07/22/2020    Final-Edited                   Value:SURGICAL PATHOLOGY CASE: [id number] PATIENT: Oluwadarasimi Blount Surgical Pathology Report     Specimen Submitted: A. Peritoneal fossa, right ovarian; bx  Clinical History: Pelvic pain R10.2.      DIAGNOSIS: A. PERITONEUM, RIGHT OVARIAN FOSSA; BIOPSY: - ENDOMETRIOSIS WITH DECIDUALIZED STROMA. - NEGATIVE FOR ATYPIA AND MALIGNANCY.  GROSS DESCRIPTION: A. Labeled: Right ovarian fossa peritoneal biopsy Received: Formalin Tissue fragment(s): Multiple Size: Aggregate, 0.4 x 0.2 x 0.2 cm Description: Received on a Telfa pad are multiple fragments of white soft tissue. Entirely submitted in 1 cassette.    Final Diagnosis performed by Quay Burow, MD.   Electronically signed 07/24/2020 8:40:11AM The electronic signature indicates that the named Attending Pathologist has evaluated the specimen Technical component performed at [facility], [address] [place], [place] [address] Lab: [phone] Dir: Rush Farmer, MD, MMM  Professional component performed at [facility], [facility], [place], [place], [place]  [address] Lab: [phone] Dir: Dellia Nims. Reuel Derby, MD     Assessment: 19 y.o. s/p diagnostic laparosocpy stable  Plan: Patient has done well after surgery with no apparent complications.  I have discussed the post-operative course to date, and the expected progress moving forward.  The patient understands what complications to be concerned about.  I will see the patient in routine follow up, or sooner if needed.    Activity plan: No restriction.  Start Freida Busman for endometriosis   Malachy Mood, MD, [facility], [facility] 07/29/2020, 5:09 PM

## 2020-08-01 ENCOUNTER — Telehealth: Payer: Self-pay

## 2020-08-01 NOTE — Telephone Encounter (Signed)
Prior authroization has been approved for [employer]. Approval dates are as follows 08/01/20-02/01/21  Pt is aware

## 2020-08-04 ENCOUNTER — Ambulatory Visit
Admission: RE | Admit: 2020-08-04 | Discharge: 2020-08-04 | Disposition: A | Discharge status: Home / Self Care | Payer: Managed Care, Other (non HMO) | Source: Ambulatory Visit | Attending: Physician Assistant | Admitting: Physician Assistant

## 2020-08-04 ENCOUNTER — Other Ambulatory Visit: Payer: Self-pay

## 2020-08-04 VITALS — BP 117/88 | HR 110 | Temp 98.7°F | Resp 16

## 2020-08-04 DIAGNOSIS — Z20822 Contact with and (suspected) exposure to covid-19: Secondary | ICD-10-CM | POA: Diagnosis not present

## 2020-08-04 DIAGNOSIS — R0981 Nasal congestion: Secondary | ICD-10-CM

## 2020-08-04 DIAGNOSIS — J029 Acute pharyngitis, unspecified: Secondary | ICD-10-CM

## 2020-08-04 DIAGNOSIS — R05 Cough: Secondary | ICD-10-CM

## 2020-08-04 DIAGNOSIS — R059 Cough, unspecified: Secondary | ICD-10-CM

## 2020-08-04 MED ORDER — LIDOCAINE VISCOUS HCL 2 % MT SOLN: OROMUCOSAL | 0 refills | Status: DC

## 2020-08-04 NOTE — ED Provider Notes (Signed)
[facility]    CSN: [id number] Arrival date & time: 08/04/20  1349      History   Chief Complaint No chief complaint on file.   HPI Amy [name] is a 19 y.o. female.   19 year old female comes in for 3 day of URI symptoms. Sore throat, headache, ear pain, sneezing/cough, rhinorrhea, nasal congestion. Fever, tmax 100.8, responsive to antipyretics.  Denies abdominal pain, nausea, vomiting, diarrhea. Shortness of breath intermittently, able to do activities. Inhaler with some relief. Denies loss of taste/smell. Living with positive COVID family member.     Past Medical History:  Diagnosis Date  . Asthma   . Broken arm   . Headache   . Vaccine for human papilloma virus (HPV) types 6, 11, 16, and 18 administered     Patient Active Problem List   Diagnosis Date Noted  . Chronic pelvic pain in female   . Family history of breast cancer 06/26/2020  . Dyspareunia in female 06/26/2020  . LLQ pain 12/27/2019  . Episodic tension-type headache, not intractable 02/06/2015  . Tension type headache 05/14/2014  . Anxiety state, unspecified 05/14/2014    Past Surgical History:  Procedure Laterality Date  . LAPAROSCOPY N/A 07/22/2020   Procedure: LAPAROSCOPY DIAGNOSTIC;  Surgeon: Malachy Mood, MD;  Location: [facility] ORS;  Service: Gynecology;  Laterality: N/A;  . OTHER SURGICAL HISTORY Right 10/2011   Right thumb    OB History    Gravida  0   Para  0   Term  0   Preterm  0   AB  0   Living  0     SAB  0   TAB  0   Ectopic  0   Multiple  0   Live Births  0           Home Medications    Prior to Admission medications   Medication Sig Start Date End Date Taking? Authorizing Provider  albuterol (VENTOLIN HFA) 108 (90 Base) MCG/ACT inhaler Inhale 2 puffs into the lungs every 4 (four) hours as needed for wheezing or shortness of breath.  01/03/19 07/18/20  [provider]  Calcium-Phosphorus-Vitamin D (CALCIUM/D3 ADULT GUMMIES)  200-96.6-200 MG-MG-UNIT CHEW Chew 1 tablet by mouth daily.     [provider]  cetirizine (ZYRTEC) 10 MG tablet Take 10 mg by mouth daily.    [provider]  Elagolix Sodium (ORILISSA) 150 MG TABS Take 1 tablet by mouth daily. 07/29/20   Malachy Mood, MD  EPINEPHrine 0.3 mg/0.3 mL IJ SOAJ injection Inject 0.3 mg into the muscle as needed for anaphylaxis.  06/30/18   [provider]  fluticasone (FLONASE) 50 MCG/ACT nasal spray Place 2 sprays into both nostrils daily.     [provider]  lidocaine (XYLOCAINE) 2 % solution 5-15 mL gurgle as needed 08/04/20   Tasia Catchings, Ishia Tenorio V, PA-C  medroxyPROGESTERone Acetate 150 MG/ML SUSY INJECT 1 ML IN THE MUSCLE ONCE FOR 1 DOSE Patient taking differently: Inject 150 mg into the skin every 3 (three) months.  05/24/39   Copland, Elmo Putt B, PA-C  montelukast (SINGULAIR) 10 MG tablet Take 10 mg by mouth daily.  04/30/19   [provider]    Family History Family History  Problem Relation Age of Onset  . Migraines Maternal Uncle        Had migraines as a teen  . ADD / ADHD Maternal Uncle   . Migraines Maternal Grandmother   . Depression Maternal Grandmother   .  Anxiety disorder Maternal Grandmother   . Diabetes Maternal Grandmother   . Hypertension Maternal Grandmother   . Breast cancer Paternal Grandmother        46s  . Breast cancer Other 60  . Stomach cancer Other   . Colon cancer Other     Social History Social History   Tobacco Use  . Smoking status: Never Smoker  . Smokeless tobacco: Never Used  Vaping Use  . Vaping Use: Never used  Substance Use Topics  . Alcohol use: Yes    Comment: occasionally  . Drug use: No     Allergies   Fruit & vegetable daily [nutritional supplements] and Other   Review of Systems Review of Systems  Reason unable to perform ROS: See HPI as above.     Physical Exam Triage Vital Signs ED Triage Vitals [08/04/20 1453]  Enc Vitals Group     BP 117/88     Pulse  Rate (!) 110     Resp 16     Temp 98.7 F (37.1 C)     Temp Source Oral     SpO2 98 %     Weight      Height      Head Circumference      Peak Flow      Pain Score      Pain Loc      Pain Edu?      Excl. in [place]?    No data found.  Updated Vital Signs BP 117/88 (BP Location: Left Arm)   Pulse (!) 110   Temp 98.7 F (37.1 C) (Oral)   Resp 16   SpO2 98%   Physical Exam Constitutional:      General: She is not in acute distress.    Appearance: Normal appearance. She is well-developed. She is not ill-appearing, toxic-appearing or diaphoretic.  HENT:     Head: Normocephalic and atraumatic.     Right Ear: Tympanic membrane, ear canal and external ear normal. Tympanic membrane is not erythematous or bulging.     Left Ear: Tympanic membrane, ear canal and external ear normal. Tympanic membrane is not erythematous or bulging.     Nose:     Right Sinus: No maxillary sinus tenderness or frontal sinus tenderness.     Left Sinus: No maxillary sinus tenderness or frontal sinus tenderness.     Mouth/Throat:     Mouth: Mucous membranes are moist.     Pharynx: Oropharynx is clear. Uvula midline.  Eyes:     Conjunctiva/sclera: Conjunctivae normal.     Pupils: Pupils are equal, round, and reactive to light.  Cardiovascular:     Rate and Rhythm: Normal rate and regular rhythm.     Comments: Tachycardia resolved. Pulmonary:     Effort: Pulmonary effort is normal. No accessory muscle usage, prolonged expiration, respiratory distress or retractions.     Breath sounds: No decreased air movement or transmitted upper airway sounds. No decreased breath sounds.     Comments: LCTAB Musculoskeletal:     Cervical back: Normal range of motion and neck supple.  Skin:    General: Skin is warm and dry.  Neurological:     Mental Status: She is alert and oriented to person, place, and time.      UC Treatments / Results  Labs (all labs ordered are listed, but only abnormal results are  displayed) Labs Reviewed  NOVEL CORONAVIRUS, NAA    EKG   Radiology No results found.  Procedures  Procedures (including critical care time)  Medications Ordered in UC Medications - No data to display  Initial Impression / Assessment and Plan / UC Course  I have reviewed the triage vital signs and the nursing notes.  Pertinent labs & imaging results that were available during my care of the patient were reviewed by me and considered in my medical decision making (see chart for details).    COVID PCR test ordered. However, given patient's exposure/symptoms, suspect COVID and will have patient follow quarantine instructions for COVID. No alarming signs on exam.  LCTAB. Symptomatic treatment discussed.  Push fluids.  Return precautions given.  Patient expresses understanding and agrees to plan.  Final Clinical Impressions(s) / UC Diagnoses   Final diagnoses:  Exposure to COVID-19 virus  Cough  Nasal congestion  Sore throat    ED Prescriptions    Medication Sig Dispense Auth. Provider   lidocaine (XYLOCAINE) 2 % solution 5-15 mL gurgle as needed 150 mL Ok Edwards, PA-C     PDMP not reviewed this encounter.   Ok Edwards, PA-C 08/04/20 1521

## 2020-08-04 NOTE — Discharge Instructions (Addendum)
COVID PCR testing ordered. As discussed, given your exposure and symptoms, I would like you to quarantine as if you are positive for COVID regardless of symptoms.  You can discontinue quarantine after 10 days of symptom onset AND 24 hours of no fever with improvement of symptoms.   Start lidocaine for sore throat, do not eat or drink for the next 40 mins after use as it can stunt your gag reflex. You can take over the counter flonase/nasacort to help with nasal congestion/drainage. Tylenol/motrin for pain and fever. Keep hydrated, urine should be clear to pale yellow in color. If experiencing shortness of breath, trouble breathing, go to the emergency department for further evaluation needed.

## 2020-08-04 NOTE — ED Triage Notes (Signed)
Pt c/o URI sx's x3 days. States positive covid exposure from her mom whom she lives with.

## 2020-08-05 LAB — NOVEL CORONAVIRUS, NAA: SARS-CoV-2, NAA: DETECTED — AB

## 2020-08-05 LAB — SARS-COV-2, NAA 2 DAY TAT

## 2020-08-18 ENCOUNTER — Other Ambulatory Visit: Payer: Self-pay

## 2020-08-18 MED ORDER — ORILISSA 150 MG PO TABS: 1.0000 | ORAL_TABLET | Freq: Every day | ORAL | 5 refills | Status: DC

## 2020-08-27 ENCOUNTER — Other Ambulatory Visit: Payer: Self-pay

## 2020-08-27 ENCOUNTER — Ambulatory Visit (INDEPENDENT_AMBULATORY_CARE_PROVIDER_SITE_OTHER): Payer: Managed Care, Other (non HMO)

## 2020-08-27 DIAGNOSIS — Z3042 Encounter for surveillance of injectable contraceptive: Secondary | ICD-10-CM

## 2020-08-27 MED ORDER — MEDROXYPROGESTERONE ACETATE 150 MG/ML IM SUSP
150.0000 mg | Freq: Once | INTRAMUSCULAR | Status: AC
  Administered 2020-08-27: 150 mg via INTRAMUSCULAR

## 2020-08-27 NOTE — Progress Notes (Signed)
Pt here for depo which was given IM right glut.  NDC# [address]-2 

## 2020-09-04 ENCOUNTER — Ambulatory Visit (INDEPENDENT_AMBULATORY_CARE_PROVIDER_SITE_OTHER): Payer: Managed Care, Other (non HMO) | Admitting: Obstetrics and Gynecology

## 2020-09-04 ENCOUNTER — Encounter: Payer: Self-pay | Admitting: Obstetrics and Gynecology

## 2020-09-04 ENCOUNTER — Other Ambulatory Visit: Payer: Self-pay

## 2020-09-04 VITALS — BP 100/60 | Ht 60.0 in | Wt 120.0 lb

## 2020-09-04 DIAGNOSIS — Z01818 Encounter for other preprocedural examination: Secondary | ICD-10-CM

## 2020-09-04 DIAGNOSIS — N809 Endometriosis, unspecified: Secondary | ICD-10-CM

## 2020-09-04 NOTE — Progress Notes (Signed)
      Postoperative Follow-up Patient presents post op from diagnostic laparoscopy 6weeks ago for pelvic pain and endometriosis.  Subjective: Patient reports marked improvement in her preop symptoms. Eating a regular diet without difficulty. Pain is controlled without any medications.  Activity: normal activities of daily living.  Has noted overall improvement in pain since starting Orilissa but has noted some headaches and nausea in the past week.  Objective: Blood pressure 100/60, height 5' (1.524 m), weight 120 lb (54.4 kg).  General: NAD Pulmonary: no increased work of breathing Abdomen: soft, non-tender, non-distended, incision(s) D/C/I Extremities: no edema Neurologic: normal gait    Admission on 08/04/2020, Discharged on 08/04/2020  Component Date Value Ref Range Status  . SARS-CoV-2, NAA 08/04/2020 Detected* Not Detected Final   Comment: Patients who have a positive COVID-19 test result may now have treatment options. Treatment options are available for patients with mild to moderate symptoms and for hospitalized patients. Visit our website at [email or web address] for resources and information. This nucleic acid amplification test was developed and its performance characteristics determined by [employer]. Nucleic acid amplification tests include RT-PCR and TMA. This test has not been FDA cleared or approved. This test has been authorized by FDA under an Emergency Use Authorization (EUA). This test is only authorized for the duration of time the declaration that circumstances exist justifying the authorization of the emergency use of in vitro diagnostic tests for detection of SARS-CoV-2 virus and/or diagnosis of COVID-19 infection under section 564(b)(1) of the Act, 21 U.S.C. 264BRA-3(E) (1), unless the authorization is terminated or revoked sooner. When diagnostic testing is negativ                          e, the possibility of a false negative  result should be considered in the context of a patient's recent exposures and the presence of clinical signs and symptoms consistent with COVID-19. An individual without symptoms of COVID-19 and who is not shedding SARS-CoV-2 virus would expect to have a negative (not detected) result in this assay.   Marland Kitchen SARS-CoV-2, NAA 2 DAY TAT 08/04/2020 Performed   Final    Assessment: 19 y.o. s/p diagnostic laparoscopy showing endometriosis stable  Plan: Patient has done well after surgery with no apparent complications.  I have discussed the post-operative course to date, and the expected progress moving forward.  The patient understands what complications to be concerned about.  I will see the patient in routine follow up, or sooner if needed.    Activity plan: No restriction.  Follow up in 3 months to assess full response to Jannett Celestine, MD, [place], [facility] 09/04/2020, 8:13 AM

## 2020-11-10 ENCOUNTER — Encounter: Payer: Self-pay | Admitting: Emergency Medicine

## 2020-11-10 ENCOUNTER — Ambulatory Visit
Admission: EM | Admit: 2020-11-10 | Discharge: 2020-11-10 | Disposition: A | Discharge status: Home / Self Care | Payer: Managed Care, Other (non HMO) | Attending: Emergency Medicine | Admitting: Emergency Medicine

## 2020-11-10 DIAGNOSIS — Z20822 Contact with and (suspected) exposure to covid-19: Secondary | ICD-10-CM

## 2020-11-10 DIAGNOSIS — J4521 Mild intermittent asthma with (acute) exacerbation: Secondary | ICD-10-CM | POA: Diagnosis not present

## 2020-11-10 DIAGNOSIS — R059 Cough, unspecified: Secondary | ICD-10-CM | POA: Diagnosis not present

## 2020-11-10 MED ORDER — PREDNISONE 20 MG PO TABS: 20.0000 mg | ORAL_TABLET | Freq: Every day | ORAL | 0 refills | Status: DC

## 2020-11-10 MED ORDER — AZITHROMYCIN 250 MG PO TABS: 250.0000 mg | ORAL_TABLET | Freq: Every day | ORAL | 0 refills | Status: DC

## 2020-11-10 MED ORDER — BENZONATATE 100 MG PO CAPS: 100.0000 mg | ORAL_CAPSULE | Freq: Three times a day (TID) | ORAL | 0 refills | Status: DC

## 2020-11-10 MED ORDER — AEROCHAMBER PLUS FLO-VU MEDIUM MISC: 1.0000 | Freq: Once | 0 refills | Status: AC

## 2020-11-10 NOTE — Discharge Instructions (Signed)

## 2020-11-10 NOTE — ED Triage Notes (Signed)
Pt c/o cough, chest tightness, nasal congestion, ear pressure, sore throat, and SOB since yesterday. States gets dizzy feeling when coughing. No distress noted, speaking in complete sentences.

## 2020-11-10 NOTE — ED Provider Notes (Signed)
[facility]    CSN: [id number] Arrival date & time: 11/10/20  7124      History   Chief Complaint Chief Complaint  Patient presents with  . Cough    HPI Amy [name] is a 19 y.o. female  Amy [name] is a 19 y.o. female here for evaluation of a cough.  The cough is non-productive, worsening over time and is aggravated by cold air, exercise and fumes. Onset of symptoms was 1 day ago, gradually worsening since that time.  Associated symptoms include dyspnea. Patient does have a history of asthma. Patient has not had recent travel. Patient does not have a history of smoking. Patient  has not had a previous chest x-ray. Patient has not had a PPD done. The following portions of the patient's history were reviewed and updated as appropriate: allergies, current medications, past family history, past medical history, past social history, past surgical history and problem list.     Past Medical History:  Diagnosis Date  . Asthma   . Broken arm   . Headache   . Vaccine for human papilloma virus (HPV) types 6, 11, 16, and 18 administered     Patient Active Problem List   Diagnosis Date Noted  . Endometriosis determined by laparoscopy 09/04/2020  . Chronic pelvic pain in female   . Family history of breast cancer 06/26/2020  . Dyspareunia in female 06/26/2020  . LLQ pain 12/27/2019  . Episodic tension-type headache, not intractable 02/06/2015  . Tension type headache 05/14/2014  . Anxiety state, unspecified 05/14/2014    Past Surgical History:  Procedure Laterality Date  . LAPAROSCOPY N/A 07/22/2020   Procedure: LAPAROSCOPY DIAGNOSTIC;  Surgeon: Malachy Mood, MD;  Location: [facility] ORS;  Service: Gynecology;  Laterality: N/A;  . OTHER SURGICAL HISTORY Right 10/2011   Right thumb    OB History    Gravida  0   Para  0   Term  0   Preterm  0   AB  0   Living  0     SAB  0   TAB  0   Ectopic  0   Multiple  0   Live Births  0             Home Medications    Prior to Admission medications   Medication Sig Start Date End Date Taking? Authorizing Provider  albuterol (VENTOLIN HFA) 108 (90 Base) MCG/ACT inhaler Inhale 2 puffs into the lungs every 4 (four) hours as needed for wheezing or shortness of breath.  01/03/19 07/18/20  [provider]  azithromycin (ZITHROMAX) 250 MG tablet Take 1 tablet (250 mg total) by mouth daily. Take first 2 tablets together, then 1 every day until finished. 11/10/20   Hall-Potvin, [place], PA-C  benzonatate (TESSALON) 100 MG capsule Take 1 capsule (100 mg total) by mouth every 8 (eight) hours. 11/10/20   Hall-Potvin, [place], PA-C  Calcium-Phosphorus-Vitamin D (CALCIUM/D3 ADULT GUMMIES) 200-96.6-200 MG-MG-UNIT CHEW Chew 1 tablet by mouth daily.     [provider]  cetirizine (ZYRTEC) 10 MG tablet Take 10 mg by mouth daily.    [provider]  Elagolix Sodium (ORILISSA) 150 MG TABS Take 1 tablet by mouth daily. 08/18/20   Malachy Mood, MD  EPINEPHrine 0.3 mg/0.3 mL IJ SOAJ injection Inject 0.3 mg into the muscle as needed for anaphylaxis.  06/30/18   [provider]  fluticasone (FLONASE) 50 MCG/ACT nasal spray Place 2 sprays into both nostrils daily.  [provider]  medroxyPROGESTERone Acetate 150 MG/ML SUSY INJECT 1 ML IN THE MUSCLE ONCE FOR 1 DOSE Patient taking differently: Inject 150 mg into the skin every 3 (three) months.  0/2/58   Copland, Elmo Putt B, PA-C  montelukast (SINGULAIR) 10 MG tablet Take 10 mg by mouth daily.  04/30/19   [provider]  predniSONE (DELTASONE) 20 MG tablet Take 1 tablet (20 mg total) by mouth daily. 11/10/20   Hall-Potvin, [place], PA-C  Spacer/Aero-Holding Chambers (AEROCHAMBER PLUS FLO-VU MEDIUM) MISC 1 each by Other route once for 1 dose. 11/10/20 11/10/20  Hall-Potvin, [place], PA-C    Family History Family History  Problem Relation Age of Onset  . Migraines Maternal Uncle        Had migraines as  a teen  . ADD / ADHD Maternal Uncle   . Migraines Maternal Grandmother   . Depression Maternal Grandmother   . Anxiety disorder Maternal Grandmother   . Diabetes Maternal Grandmother   . Hypertension Maternal Grandmother   . Breast cancer Paternal Grandmother        28s  . Breast cancer Other 60  . Stomach cancer Other   . Colon cancer Other     Social History Social History   Tobacco Use  . Smoking status: Never Smoker  . Smokeless tobacco: Never Used  Vaping Use  . Vaping Use: Never used  Substance Use Topics  . Alcohol use: Yes    Comment: occasionally  . Drug use: No     Allergies   Fruit & vegetable daily [nutritional supplements] and Other   Review of Systems Review of Systems  Constitutional: Negative for fatigue and fever.  HENT: Positive for congestion, postnasal drip and rhinorrhea. Negative for ear pain, sinus pain, sore throat and voice change.   Eyes: Negative for pain, redness and visual disturbance.  Respiratory: Positive for cough, chest tightness and shortness of breath. Negative for wheezing.   Cardiovascular: Negative for chest pain and palpitations.  Gastrointestinal: Negative for abdominal pain, diarrhea and vomiting.  Musculoskeletal: Negative for arthralgias and myalgias.  Skin: Negative for rash and wound.  Neurological: Negative for syncope and headaches.     Physical Exam Triage Vital Signs ED Triage Vitals [11/10/20 0901]  Enc Vitals Group     BP 122/85     Pulse Rate 85     Resp 18     Temp 98.5 F (36.9 C)     Temp Source Oral     SpO2 98 %     Weight      Height      Head Circumference      Peak Flow      Pain Score 0     Pain Loc      Pain Edu?      Excl. in [place]?    No data found.  Updated Vital Signs BP 122/85 (BP Location: Left Arm)   Pulse 85   Temp 98.5 F (36.9 C) (Oral)   Resp 18   SpO2 98%   Visual Acuity Right Eye Distance:   Left Eye Distance:   Bilateral Distance:    Right Eye Near:   Left Eye  Near:    Bilateral Near:     Physical Exam Constitutional:      General: She is not in acute distress.    Appearance: She is not ill-appearing or diaphoretic.  HENT:     Head: Normocephalic and atraumatic.     Mouth/Throat:  Mouth: Mucous membranes are moist.     Pharynx: Oropharynx is clear. No oropharyngeal exudate or posterior oropharyngeal erythema.  Eyes:     General: No scleral icterus.    Conjunctiva/sclera: Conjunctivae normal.     Pupils: Pupils are equal, round, and reactive to light.  Neck:     Comments: Trachea midline, negative JVD Cardiovascular:     Rate and Rhythm: Normal rate and regular rhythm.     Heart sounds: No murmur heard.  No gallop.   Pulmonary:     Effort: Pulmonary effort is normal. No respiratory distress.     Breath sounds: Examination of the left-lower field reveals rales. Rales present. No decreased breath sounds, wheezing or rhonchi.  Musculoskeletal:     Cervical back: Neck supple. No tenderness.  Lymphadenopathy:     Cervical: No cervical adenopathy.  Skin:    Capillary Refill: Capillary refill takes less than 2 seconds.     Coloration: Skin is not jaundiced or pale.     Findings: No rash.  Neurological:     General: No focal deficit present.     Mental Status: She is alert and oriented to person, place, and time.      UC Treatments / Results  Labs (all labs ordered are listed, but only abnormal results are displayed) Labs Reviewed  NOVEL CORONAVIRUS, NAA    EKG   Radiology No results found.  Procedures Procedures (including critical care time)  Medications Ordered in UC Medications - No data to display  Initial Impression / Assessment and Plan / UC Course  I have reviewed the triage vital signs and the nursing notes.  Pertinent labs & imaging results that were available during my care of the patient were reviewed by me and considered in my medical decision making (see chart for details).     Patient afebrile,  nontoxic, with SpO2 98%.  Covid PCR pending.  Patient to quarantine until results are back.  We will treat supportively as outlined below: Given that patient had URI approximately 1 month ago with rales in left lower lobe, will provide azithromycin to treat for possible pneumonia/atypical infection..  Return precautions discussed, patient verbalized understanding and is agreeable to plan. Final Clinical Impressions(s) / UC Diagnoses   Final diagnoses:  Encounter for screening laboratory testing for COVID-19 virus  Cough  Mild intermittent asthma with acute exacerbation     Discharge Instructions     Tessalon for cough. Start flonase, atrovent nasal spray for nasal congestion/drainage. You can use over the counter nasal saline rinse such as neti pot for nasal congestion. Keep hydrated, your urine should be clear to pale yellow in color. Tylenol/motrin for fever and pain. Monitor for any worsening of symptoms, chest pain, shortness of breath, wheezing, swelling of the throat, go to the emergency department for further evaluation needed.     ED Prescriptions    Medication Sig Dispense Auth. Provider   Spacer/Aero-Holding Chambers (AEROCHAMBER PLUS FLO-VU MEDIUM) MISC 1 each by Other route once for 1 dose. 1 each Hall-Potvin, [place], PA-C   predniSONE (DELTASONE) 20 MG tablet Take 1 tablet (20 mg total) by mouth daily. 5 tablet Hall-Potvin, [place], PA-C   azithromycin (ZITHROMAX) 250 MG tablet Take 1 tablet (250 mg total) by mouth daily. Take first 2 tablets together, then 1 every day until finished. 6 tablet Hall-Potvin, [place], PA-C   benzonatate (TESSALON) 100 MG capsule Take 1 capsule (100 mg total) by mouth every 8 (eight) hours. 21 capsule Hall-Potvin, [place], PA-C  PDMP not reviewed this encounter.   Neldon [place], [place] 11/10/20 [phone]

## 2020-11-11 LAB — NOVEL CORONAVIRUS, NAA: SARS-CoV-2, NAA: NOT DETECTED

## 2020-11-11 LAB — SARS-COV-2, NAA 2 DAY TAT

## 2020-11-19 ENCOUNTER — Other Ambulatory Visit: Payer: Self-pay

## 2020-11-19 ENCOUNTER — Ambulatory Visit (INDEPENDENT_AMBULATORY_CARE_PROVIDER_SITE_OTHER): Payer: Managed Care, Other (non HMO)

## 2020-11-19 DIAGNOSIS — Z3042 Encounter for surveillance of injectable contraceptive: Secondary | ICD-10-CM | POA: Diagnosis not present

## 2020-11-19 MED ORDER — MEDROXYPROGESTERONE ACETATE 150 MG/ML IM SUSP
150.0000 mg | Freq: Once | INTRAMUSCULAR | Status: AC
  Administered 2020-11-19: 150 mg via INTRAMUSCULAR

## 2020-11-19 NOTE — Progress Notes (Signed)
Patient presents today for Depo Provera injection within dates. Given IM LUOQ.  Patient tolerated well. Patient also requested a flu vaccine today. She admits to currently being sick and on prednisone. Advised needs to be well within 2 weeks prior to receiving vaccine. Flu vaccine not administered today.

## 2020-12-05 ENCOUNTER — Telehealth (INDEPENDENT_AMBULATORY_CARE_PROVIDER_SITE_OTHER): Payer: Managed Care, Other (non HMO) | Admitting: Obstetrics and Gynecology

## 2020-12-05 ENCOUNTER — Ambulatory Visit: Payer: Managed Care, Other (non HMO) | Admitting: Obstetrics and Gynecology

## 2020-12-05 DIAGNOSIS — N809 Endometriosis, unspecified: Secondary | ICD-10-CM

## 2020-12-05 NOTE — Progress Notes (Signed)
I connected with Amy [name] on 12/11/20 at  4:10 PM EST by video visit and verified that I am speaking with the correct person using two identifiers.   I discussed the limitations, risks, security and privacy concerns of performing an evaluation and management service by video and the availability of in person appointments. I also discussed with the patient that there may be a patient responsible charge related to this service. The patient expressed understanding and agreed to proceed.  The patient was at home I spoke with the patient from my workstation phone The names of people involved in this encounter were: Amy [name] , and [facility] Visit   Chief Complaint: No chief complaint on file.   History of Present Illness: 19 y.o. G0P0000 presenting for medication follow up for a diagnosis of laparoscopically proven endometriosis.  She is currently being managed with orilissa and NSAIDs.   The patient reports good control of symptoms on her current regimen.  On her current medication regimen the patient has achieved amenorrhea.   She has not noted any side-effects or new symptoms.    Review of Systems: Review of Systems  Constitutional: Negative.   Gastrointestinal: Negative.   Genitourinary: Negative.      Past Medical History:  Past Medical History:  Diagnosis Date  . Asthma   . Broken arm   . Headache   . Vaccine for human papilloma virus (HPV) types 6, 11, 16, and 18 administered     Past Surgical History:  Past Surgical History:  Procedure Laterality Date  . LAPAROSCOPY N/A 07/22/2020   Procedure: LAPAROSCOPY DIAGNOSTIC;  Surgeon: Malachy Mood, MD;  Location: [facility] ORS;  Service: Gynecology;  Laterality: N/A;  . OTHER SURGICAL HISTORY Right 10/2011   Right thumb    Gynecologic History: No LMP recorded. Patient has had an injection.  Obstetric History: G0P0000  Family History:  Family History  Problem Relation Age  of Onset  . Migraines Maternal Uncle        Had migraines as a teen  . ADD / ADHD Maternal Uncle   . Migraines Maternal Grandmother   . Depression Maternal Grandmother   . Anxiety disorder Maternal Grandmother   . Diabetes Maternal Grandmother   . Hypertension Maternal Grandmother   . Breast cancer Paternal Grandmother        44s  . Breast cancer Other 60  . Stomach cancer Other   . Colon cancer Other     Social History:  Social History   Socioeconomic History  . Marital status: Single    Spouse name: Not on file  . Number of children: Not on file  . Years of education: Not on file  . Highest education level: Not on file  Occupational History  . Not on file  Tobacco Use  . Smoking status: Never Smoker  . Smokeless tobacco: Never Used  Vaping Use  . Vaping Use: Never used  Substance and Sexual Activity  . Alcohol use: Yes    Comment: occasionally  . Drug use: No  . Sexual activity: Yes    Birth control/protection: Injection  Other Topics Concern  . Not on file  Social History Narrative  . Not on file   Social Determinants of Health   Financial Resource Strain: Not on file  Food Insecurity: Not on file  Transportation Needs: Not on file  Physical Activity: Not on file  Stress: Not on file  Social Connections: Not on  file  Intimate Partner Violence: Not on file    Allergies:  Allergies  Allergen Reactions  . Fruit & Vegetable Daily [Nutritional Supplements] Itching    All fruit  . Other     Seasonal Allergies    Medications: Prior to Admission medications   Medication Sig Start Date End Date Taking? Authorizing Provider  albuterol (VENTOLIN HFA) 108 (90 Base) MCG/ACT inhaler Inhale 2 puffs into the lungs every 4 (four) hours as needed for wheezing or shortness of breath.  01/03/19 07/18/20  [provider]  azithromycin (ZITHROMAX) 250 MG tablet Take 1 tablet (250 mg total) by mouth daily. Take first 2 tablets together, then 1 every day until  finished. 11/10/20   Hall-Potvin, [place], PA-C  benzonatate (TESSALON) 100 MG capsule Take 1 capsule (100 mg total) by mouth every 8 (eight) hours. 11/10/20   Hall-Potvin, [place], PA-C  Calcium-Phosphorus-Vitamin D (CALCIUM/D3 ADULT GUMMIES) 200-96.6-200 MG-MG-UNIT CHEW Chew 1 tablet by mouth daily.     [provider]  cetirizine (ZYRTEC) 10 MG tablet Take 10 mg by mouth daily.    [provider]  Elagolix Sodium (ORILISSA) 150 MG TABS Take 1 tablet by mouth daily. 08/18/20   Malachy Mood, MD  EPINEPHrine 0.3 mg/0.3 mL IJ SOAJ injection Inject 0.3 mg into the muscle as needed for anaphylaxis.  06/30/18   [provider]  fluticasone (FLONASE) 50 MCG/ACT nasal spray Place 2 sprays into both nostrils daily.     [provider]  medroxyPROGESTERone Acetate 150 MG/ML SUSY INJECT 1 ML IN THE MUSCLE ONCE FOR 1 DOSE Patient taking differently: Inject 150 mg into the skin every 3 (three) months.  12/21/85   Copland, Elmo Putt B, PA-C  montelukast (SINGULAIR) 10 MG tablet Take 10 mg by mouth daily.  04/30/19   [provider]  predniSONE (DELTASONE) 20 MG tablet Take 1 tablet (20 mg total) by mouth daily. 11/10/20   Hall-Potvin, [place], [place]    Physical Exam Vitals: There were no vitals filed for this visit. No LMP recorded. Patient has had an injection.  No physical exam as this was a remote telephone visit to promote social distancing during the current COVID-19 Pandemic  Assessment: 19 y.o. G0P0000 follow up endometriosis  Plan: Problem List Items Addressed This Visit      Other   Endometriosis determined by laparoscopy - Primary     1) Endometriosis - good response to [place].  Continues on depo provera for contraception.  Given both agents adversely affect bone density we discussed switchting to one of the levonorgestral IUD and patient is interestedin Kylenna.  Will plan on placement in March at time next depo shot is due  2) Visit time: 15  minutes   3) Return in about 11 weeks (around 02/20/2021) for [facility] IUD insertion.     Malachy Mood, MD, [facility], [facility] 12/05/2020, 4:25 PM

## 2020-12-15 ENCOUNTER — Telehealth: Payer: Self-pay | Admitting: Obstetrics and Gynecology

## 2020-12-15 NOTE — Telephone Encounter (Signed)
Pt will need Kyleena IUD for 02/20/21 with AMS AT 1:50 PM.

## 2021-02-11 ENCOUNTER — Ambulatory Visit: Payer: Managed Care, Other (non HMO)

## 2021-02-20 ENCOUNTER — Encounter: Payer: Self-pay | Admitting: Obstetrics and Gynecology

## 2021-02-20 ENCOUNTER — Ambulatory Visit (INDEPENDENT_AMBULATORY_CARE_PROVIDER_SITE_OTHER): Payer: Managed Care, Other (non HMO) | Admitting: Obstetrics and Gynecology

## 2021-02-20 ENCOUNTER — Other Ambulatory Visit: Payer: Self-pay

## 2021-02-20 VITALS — BP 106/62 | Ht 60.0 in | Wt 121.4 lb

## 2021-02-20 DIAGNOSIS — Z30430 Encounter for insertion of intrauterine contraceptive device: Secondary | ICD-10-CM

## 2021-02-20 NOTE — Telephone Encounter (Signed)
[name] reserved for this patient.

## 2021-02-20 NOTE — Progress Notes (Signed)
   GYNECOLOGY OFFICE PROCEDURE NOTE  BONNEY BERRES is a 20 y.o. G0P0000 here for a [place] IUD insertion. No GYN concerns.  The indication for her IUD is cycle control.  IUD Insertion Procedure Note Patient identified, informed consent performed, consent signed.   Discussed risks of irregular bleeding, cramping, infection, malpositioning, expulsion or uterine perforation of the IUD (1:1000 placements)  which may require further procedure such as laparoscopy.  IUD while effective at preventing pregnancy do not prevent transmission of sexually transmitted diseases and use of barrier methods for this purpose was discussed. Time out was performed.  Urine pregnancy test negative.  Speculum placed in the vagina.  Cervix visualized.  Cleaned with Betadine x 2.  Grasped anteriorly with a single tooth tenaculum.  Uterus sounded to 6 cm. IUD placed per manufacturer's recommendations.  Strings trimmed to 3 cm. Tenaculum was removed, good hemostasis noted.  Patient tolerated procedure well.   Patient was given post-procedure instructions.  She was advised to have backup contraception for one week.  Patient was also asked to check IUD strings periodically and follow up in 6 weeks for IUD check.  Malachy Mood, MD, [facility], [place]

## 2021-03-09 ENCOUNTER — Ambulatory Visit
Admission: RE | Admit: 2021-03-09 | Discharge: 2021-03-09 | Disposition: A | Discharge status: Home / Self Care | Payer: Managed Care, Other (non HMO) | Source: Ambulatory Visit

## 2021-03-09 ENCOUNTER — Ambulatory Visit (INDEPENDENT_AMBULATORY_CARE_PROVIDER_SITE_OTHER): Payer: Managed Care, Other (non HMO)

## 2021-03-09 ENCOUNTER — Other Ambulatory Visit: Payer: Self-pay

## 2021-03-09 VITALS — BP 124/85 | HR 74 | Temp 98.6°F | Resp 18

## 2021-03-09 DIAGNOSIS — R0781 Pleurodynia: Secondary | ICD-10-CM | POA: Diagnosis not present

## 2021-03-09 DIAGNOSIS — J4521 Mild intermittent asthma with (acute) exacerbation: Secondary | ICD-10-CM | POA: Diagnosis not present

## 2021-03-09 DIAGNOSIS — R0602 Shortness of breath: Secondary | ICD-10-CM | POA: Diagnosis not present

## 2021-03-09 MED ORDER — PREDNISONE 20 MG PO TABS: 40.0000 mg | ORAL_TABLET | Freq: Every day | ORAL | 0 refills | Status: DC

## 2021-03-09 NOTE — ED Triage Notes (Signed)
Pt c/o SOB for the past week and a half. States took a nap yesterday and woke up with bilateral rib pain. States hx of asthma and using her inhaler with some relief. No distress noted, speaking in complete sentences.

## 2021-03-09 NOTE — ED Provider Notes (Signed)
[facility]    CSN: [id number] Arrival date & time: 03/09/21  0900      History   Chief Complaint Chief Complaint  Patient presents with  . appt 9  . Chest Pain    HPI Amy [name] is a 20 y.o. female.   HPI  Patient reports increase use of inhaler due to persistent symptoms of shortness of breath and wheezing and a nonproductive cough over the last 2 weeks.  Patient works at [employer] and endorses a history of mild to moderate asthma as well as outdoor allergies.  She has a chronic daily regimen which includes Xyzal and Singulair which she reports compliance.  She has been using her inhaler almost daily over the last 2 weeks.  It is written for her to do 2 puffs every 4 hours as needed however she reports she is only been using once per day.  Oxygen level was normal.  Denies any other concerning symptoms such as fever, body aches, nausea or vomiting.  She also endorses some lower rib pain that she awakened with on yesterday.  No recent history of prednisone use due to asthma exacerbation or history of pneumonia.  Past Medical History:  Diagnosis Date  . Asthma   . Broken arm   . Headache   . Vaccine for human papilloma virus (HPV) types 6, 11, 16, and 18 administered     Patient Active Problem List   Diagnosis Date Noted  . Endometriosis determined by laparoscopy 09/04/2020  . Chronic pelvic pain in female   . Family history of breast cancer 06/26/2020  . Dyspareunia in female 06/26/2020  . LLQ pain 12/27/2019  . Episodic tension-type headache, not intractable 02/06/2015  . Tension type headache 05/14/2014  . Anxiety state, unspecified 05/14/2014    Past Surgical History:  Procedure Laterality Date  . LAPAROSCOPY N/A 07/22/2020   Procedure: LAPAROSCOPY DIAGNOSTIC;  Surgeon: Malachy Mood, MD;  Location: [facility] ORS;  Service: Gynecology;  Laterality: N/A;  . OTHER SURGICAL HISTORY Right 10/2011   Right thumb    OB History    Gravida  0   Para  0    Term  0   Preterm  0   AB  0   Living  0     SAB  0   IAB  0   Ectopic  0   Multiple  0   Live Births  0            Home Medications    Prior to Admission medications   Medication Sig Start Date End Date Taking? Authorizing Provider  levocetirizine (XYZAL) 5 MG tablet Take 5 mg by mouth every evening.   Yes [provider]  albuterol (VENTOLIN HFA) 108 (90 Base) MCG/ACT inhaler Inhale 2 puffs into the lungs every 4 (four) hours as needed for wheezing or shortness of breath.  01/03/19 07/18/20  [provider]  benzonatate (TESSALON) 100 MG capsule Take 1 capsule (100 mg total) by mouth every 8 (eight) hours. Patient not taking: Reported on 02/20/2021 11/10/20   Hall-Potvin, [place], PA-C  Calcium-Phosphorus-Vitamin D (CALCIUM/D3 ADULT GUMMIES) 200-96.6-200 MG-MG-UNIT CHEW Chew 1 tablet by mouth daily.     [provider]  Elagolix Sodium (ORILISSA) 150 MG TABS Take 1 tablet by mouth daily. 08/18/20   Malachy Mood, MD  EPINEPHrine 0.3 mg/0.3 mL IJ SOAJ injection Inject 0.3 mg into the muscle as needed for anaphylaxis.  06/30/18   [provider]  fluticasone (FLONASE) 50  MCG/ACT nasal spray Place 2 sprays into both nostrils daily.     [provider]  montelukast (SINGULAIR) 10 MG tablet Take 10 mg by mouth daily.  04/30/19   [provider]    Family History Family History  Problem Relation Age of Onset  . Migraines Maternal Uncle        Had migraines as a teen  . ADD / ADHD Maternal Uncle   . Migraines Maternal Grandmother   . Depression Maternal Grandmother   . Anxiety disorder Maternal Grandmother   . Diabetes Maternal Grandmother   . Hypertension Maternal Grandmother   . Breast cancer Paternal Grandmother        34s  . Breast cancer Other 60  . Stomach cancer Other   . Colon cancer Other     Social History Social History   Tobacco Use  . Smoking status: Never Smoker  . Smokeless tobacco: Never Used   Vaping Use  . Vaping Use: Never used  Substance Use Topics  . Alcohol use: Yes    Comment: occasionally  . Drug use: No     Allergies   Fruit & vegetable daily [nutritional supplements] and Other   Review of Systems Review of Systems Pertinent negatives listed in HPI   Physical Exam Triage Vital Signs ED Triage Vitals [03/09/21 0916]  Enc Vitals Group     BP 124/85     Pulse Rate 74     Resp 18     Temp 98.6 F (37 C)     Temp Source Oral     SpO2 98 %     Weight      Height      Head Circumference      Peak Flow      Pain Score 0     Pain Loc      Pain Edu?      Excl. in [place]?    No data found.  Updated Vital Signs BP 124/85 (BP Location: Left Arm)   Pulse 74   Temp 98.6 F (37 C) (Oral)   Resp 18   SpO2 98%   Visual Acuity Right Eye Distance:   Left Eye Distance:   Bilateral Distance:    Right Eye Near:   Left Eye Near:    Bilateral Near:     Physical Exam General appearance: Alert, no distress, cooperative  Head: Normocephalic, without obvious abnormality, atraumatic ENT: External ears normal, nares congestion present, oropharynx w/o exudate Respiratory: Respirations even , unlabored, coarse lung sound, no active wheeze Heart: rate and rhythm normal. No gallop or murmurs noted on exam  Abdomen: BS +, no distention, no rebound tenderness, or no mass Extremities: No gross deformities Skin: Skin color, texture, turgor normal. No rashes seen  Psych: Appropriate mood and affect. Neurologic: GCS 15, normal coordination normal gait UC Treatments / Results  Labs (all labs ordered are listed, but only abnormal results are displayed) Labs Reviewed - No data to display  EKG   Radiology No results found.  Procedures Procedures (including critical care time)  Medications Ordered in UC Medications - No data to display  Initial Impression / Assessment and Plan / UC Course  I have reviewed the triage vital signs and the nursing  notes.  Pertinent labs & imaging results that were available during my care of the patient were reviewed by me and considered in my medical decision making (see chart for details).   Acute asthma exacerbation continue current regimen.  We  will add prednisone 40 mg once daily.  Encouraged use of albuterol inhaler during periods of acute shortness of breath and/or wheezing and encouraged to use as prescribed.  Return precautions discussed.  Follow-up with PCP as needed.  Final Clinical Impressions(s) / UC Diagnoses   Final diagnoses:  Mild intermittent asthma with acute exacerbation   Discharge Instructions   None    ED Prescriptions    Medication Sig Dispense Auth. Provider   predniSONE (DELTASONE) 20 MG tablet Take 2 tablets (40 mg total) by mouth daily with breakfast. 10 tablet Scot Jun, FNP     PDMP not reviewed this encounter.   Scot Jun, FNP 03/09/21 1014

## 2021-03-31 ENCOUNTER — Ambulatory Visit (INDEPENDENT_AMBULATORY_CARE_PROVIDER_SITE_OTHER): Payer: Managed Care, Other (non HMO) | Admitting: Obstetrics and Gynecology

## 2021-03-31 ENCOUNTER — Other Ambulatory Visit: Payer: Self-pay

## 2021-03-31 ENCOUNTER — Encounter: Payer: Self-pay | Admitting: Obstetrics and Gynecology

## 2021-03-31 VITALS — BP 100/58 | Ht 61.0 in | Wt 121.0 lb

## 2021-03-31 DIAGNOSIS — Z30431 Encounter for routine checking of intrauterine contraceptive device: Secondary | ICD-10-CM

## 2021-03-31 DIAGNOSIS — N809 Endometriosis, unspecified: Secondary | ICD-10-CM

## 2021-03-31 NOTE — Progress Notes (Signed)
Obstetrics & Gynecology Office Visit   Chief Complaint:  Chief Complaint  Patient presents with  . Follow-up    IUD string check - no concerns. RM 4    History of Present Illness: 20 y.o. patient presenting for follow up of [place] IUD placement 6+ weeks ago.  The indication for her IUD was endometriosis.  She denies any complications since her IUD placement.  No bleeding since placement.  is not able to feel strings.  Has already noted improvement in her endometriosis symptoms/pelvic pain since placement.    Review of Systems: Review of Systems  Constitutional: Negative.   Gastrointestinal: Negative.   Genitourinary: Negative.     Past Medical History:  Past Medical History:  Diagnosis Date  . Asthma   . Broken arm   . Headache   . Vaccine for human papilloma virus (HPV) types 6, 11, 16, and 18 administered     Past Surgical History:  Past Surgical History:  Procedure Laterality Date  . LAPAROSCOPY N/A 07/22/2020   Procedure: LAPAROSCOPY DIAGNOSTIC;  Surgeon: Malachy Mood, MD;  Location: [facility] ORS;  Service: Gynecology;  Laterality: N/A;  . OTHER SURGICAL HISTORY Right 10/2011   Right thumb    Gynecologic History: No LMP recorded. (Menstrual status: IUD).  Obstetric History: G0P0000  Family History:  Family History  Problem Relation Age of Onset  . Migraines Maternal Uncle        Had migraines as a teen  . ADD / ADHD Maternal Uncle   . Migraines Maternal Grandmother   . Depression Maternal Grandmother   . Anxiety disorder Maternal Grandmother   . Diabetes Maternal Grandmother   . Hypertension Maternal Grandmother   . Breast cancer Paternal Grandmother        49s  . Breast cancer Other 60  . Stomach cancer Other   . Colon cancer Other     Social History:  Social History   Socioeconomic History  . Marital status: Single    Spouse name: Not on file  . Number of children: Not on file  . Years of education: Not on file  . Highest education level:  Not on file  Occupational History  . Not on file  Tobacco Use  . Smoking status: Never Smoker  . Smokeless tobacco: Never Used  Vaping Use  . Vaping Use: Never used  Substance and Sexual Activity  . Alcohol use: Yes    Comment: occasionally  . Drug use: No  . Sexual activity: Yes    Birth control/protection: I.U.D.  Other Topics Concern  . Not on file  Social History Narrative  . Not on file   Social Determinants of Health   Financial Resource Strain: Not on file  Food Insecurity: Not on file  Transportation Needs: Not on file  Physical Activity: Not on file  Stress: Not on file  Social Connections: Not on file  Intimate Partner Violence: Not on file    Allergies:  Allergies  Allergen Reactions  . Fruit & Vegetable Daily [Nutritional Supplements] Itching    All fruit  . Other     Seasonal Allergies    Medications: Prior to Admission medications   Medication Sig Start Date End Date Taking? Authorizing Provider  Calcium-Phosphorus-Vitamin D (CALCIUM/D3 ADULT GUMMIES) 200-96.6-200 MG-MG-UNIT CHEW Chew 1 tablet by mouth daily.    Yes [provider]  Elagolix Sodium (ORILISSA) 150 MG TABS Take 1 tablet by mouth daily. 08/18/20  Yes Malachy Mood, MD  fluticasone [facility]) 50  MCG/ACT nasal spray Place 2 sprays into both nostrils daily.    Yes [provider]  levocetirizine (XYZAL) 5 MG tablet Take 5 mg by mouth every evening.   Yes [provider]  montelukast (SINGULAIR) 10 MG tablet Take 10 mg by mouth daily.  04/30/19  Yes [provider]  albuterol (VENTOLIN HFA) 108 (90 Base) MCG/ACT inhaler Inhale 2 puffs into the lungs every 4 (four) hours as needed for wheezing or shortness of breath.  01/03/19 07/18/20  [provider]  EPINEPHrine 0.3 mg/0.3 mL IJ SOAJ injection Inject 0.3 mg into the muscle as needed for anaphylaxis.  06/30/18   [provider]    Physical Exam Blood pressure (!) 100/58, height 5\' 1"   (1.549 m), weight 121 lb (54.9 kg). No LMP recorded. (Menstrual status: IUD).  General: NAD HEENT: normocephalic, anicteric Pulmonary: No increased work of breathing  Genitourinary:  External: Normal external female genitalia.  Normal urethral meatus, normal  Bartholin's and Skene's glands.    Vagina: Normal vaginal mucosa, no evidence of prolapse.    Cervix: Grossly normal in appearance, no bleeding, IUD strings visualized 2cm  Uterus: Non-enlarged, mobile, normal contour.  No CMT  Adnexa: ovaries non-enlarged, no adnexal masses  Rectal: deferred  Lymphatic: no evidence of inguinal lymphadenopathy Extremities: no edema, erythema, or tenderness Neurologic: Grossly intact Psychiatric: mood appropriate, affect full  Female chaperone present for pelvic and breast  portions of the physical exam  Assessment: 20 y.o. G0P0000 No problem-specific Assessment & Plan notes found for this encounter.   Plan: Problem List Items Addressed This Visit      Other   Endometriosis determined by laparoscopy    Other Visit Diagnoses    IUD check up    -  Primary       1.  The patient was given instructions to check her IUD strings monthly and call with any problems or concerns.  She should call for fevers, chills, abnormal vaginal discharge, pelvic pain, or other complaints.  2.   IUDs while effective at preventing pregnancy do not prevent transmission of sexually transmitted diseases and use of barrier methods for this purpose was discussed.  Low overall incidence of failure with 99.7% efficacy rate in typical use.  The patient has not contraindication to IUD placement.  3.  She will return in 6 months for follow up to see how she is doing with her endometriosis symptoms.  All questions answered. - continue Orilissa   4) A total of 15 minutes were spent in face-to-face contact with the patient during this encounter with over half of that time devoted to counseling and coordination of  care.  5) Return in about 6 months (around 09/30/2021) for Follow up endometriosis.   Malachy Mood, MD, [facility], [facility] 03/31/2021, 9:10 AM

## 2021-04-14 ENCOUNTER — Telehealth: Payer: Self-pay

## 2021-04-14 NOTE — Telephone Encounter (Signed)
Pt calling; ordered orilissa thru mychart to [facility]; recv'd msg that it is delayed d/t needing dr's order; ordered again and it will not be here on time; can she have a week's worth of pills or samples?  She took her last pill yesterday.  [phone]

## 2021-04-14 NOTE — Telephone Encounter (Signed)
I don't know. When is he back?

## 2021-04-22 ENCOUNTER — Other Ambulatory Visit: Payer: Self-pay | Admitting: Obstetrics and Gynecology

## 2021-04-22 MED ORDER — ORILISSA 150 MG PO TABS
1.0000 | ORAL_TABLET | Freq: Every day | ORAL | 11 refills | Status: DC
Start: 2021-04-22 — End: 2021-09-14

## 2021-04-22 NOTE — Telephone Encounter (Signed)
So why am I just getting this now?  A new Rx has been called in.  While she waits for the new Rx if we have any 150mg  samples can we leave those at the front desk for her.

## 2021-04-23 NOTE — Telephone Encounter (Signed)
Patient is going to speak with Butch Penny. Patient would appreciate communitcation thru mychart due to being at work until 5 pm today. Patient aware Butch Penny is at lunch and will contact patient accordingly.

## 2021-05-29 NOTE — Telephone Encounter (Signed)
Butch Penny spoke c pt.

## 2021-07-23 ENCOUNTER — Other Ambulatory Visit: Payer: Self-pay

## 2021-07-23 ENCOUNTER — Encounter: Payer: Self-pay | Admitting: Allergy and Immunology

## 2021-07-23 ENCOUNTER — Ambulatory Visit (INDEPENDENT_AMBULATORY_CARE_PROVIDER_SITE_OTHER): Payer: Managed Care, Other (non HMO) | Admitting: Allergy and Immunology

## 2021-07-23 VITALS — BP 102/62 | HR 80 | Resp 16 | Ht 60.0 in | Wt 126.4 lb

## 2021-07-23 DIAGNOSIS — J329 Chronic sinusitis, unspecified: Secondary | ICD-10-CM

## 2021-07-23 DIAGNOSIS — K219 Gastro-esophageal reflux disease without esophagitis: Secondary | ICD-10-CM

## 2021-07-23 DIAGNOSIS — R430 Anosmia: Secondary | ICD-10-CM

## 2021-07-23 DIAGNOSIS — J3089 Other allergic rhinitis: Secondary | ICD-10-CM

## 2021-07-23 DIAGNOSIS — J301 Allergic rhinitis due to pollen: Secondary | ICD-10-CM

## 2021-07-23 DIAGNOSIS — J4530 Mild persistent asthma, uncomplicated: Secondary | ICD-10-CM

## 2021-07-23 DIAGNOSIS — T781XXA Other adverse food reactions, not elsewhere classified, initial encounter: Secondary | ICD-10-CM

## 2021-07-23 MED ORDER — ARMONAIR DIGIHALER 232 MCG/ACT IN AEPB: INHALATION_SPRAY | RESPIRATORY_TRACT | 5 refills | Status: DC

## 2021-07-23 MED ORDER — EPINEPHRINE 0.3 MG/0.3ML IJ SOAJ: 0.3000 mg | INTRAMUSCULAR | 1 refills | Status: AC | PRN

## 2021-07-23 MED ORDER — OMEPRAZOLE 40 MG PO CPDR: 40.0000 mg | DELAYED_RELEASE_CAPSULE | Freq: Every day | ORAL | 5 refills | Status: DC

## 2021-07-23 MED ORDER — CETIRIZINE HCL 10 MG PO TABS: ORAL_TABLET | ORAL | 5 refills | Status: DC

## 2021-07-23 NOTE — Patient Instructions (Addendum)
  1.  Allergen avoidance measures -dust mite, cat, dog, other mammals, pollens, foods that give rise to oral cavity reaction  2.  Treat and prevent inflammation:  A. Armonair 232 - 1 inhalation 1 time per day B. OTC Nasacort - 1 spray each nostril 3-7 times per week C. Montelukast 10 mg - 1 tablet 1 time per day  3.  Treat and prevent reflux/LPR/chest pain:  A. Minimize all caffeine consumption B. Omeprazole 40 mg - 1 tablet 1 time per day  4.  If needed:  A. Albuterol HFA - 2 inhalations every 4-6 hours B. Cetirizine 10 mg - 1 tablet 1-2 times per day C. Pataday - 1 drop each eye 1 time per day D. Auvi-Q 0.3, benadryl, MD/ER evaluation for allergic reaction  5.  Consider a course of immunotherapy  6.  Obtain fall flu vaccine in September/October  7.  Obtain blood tests - CBC w/d, IgE  8.  Obtain sinus CT scan for chronic sinusitis and anosmia  9.  Return to clinic in 4 weeks or earlier if problem

## 2021-07-23 NOTE — Progress Notes (Signed)
[facility] - [place]   Dear Wayland Denis,  Thank you for referring [name] to the [facility] on 07/23/2021.   Below is a summation of this patient's evaluation and recommendations.  Thank you for your referral. I will keep you informed about this patient's response to treatment.   If you have any questions please do not hesitate to contact me.   Sincerely,  Jiles Prows, MD Allergy / Immunology [place]   ______________________________________________________________________    NEW PATIENT NOTE  Referring Provider: Wayland Denis, PA-C Primary Provider: Wayland Denis, PA-C Date of office visit: 07/23/2021    Subjective:   Chief Complaint:  Amy [name] (DOB: December 12, 2001) is a 20 y.o. female who presents to the clinic on 07/23/2021 with a chief complaint of Allergic Rhinitis  and Asthma .     HPI: Amy [name] presents to this clinic in evaluation of persistent respiratory tract issues.  She has a long history of nasal congestion and sneezing and itchy watery eyes and itchy mouth occurring on a perennial basis that might be somewhat worse regarding her eyes in the winter and somewhat worse regarding her nose in the spring following exposure to cat and dog.  Significantly, she has decreased ability to smell and on most days cannot smell food.  She does not have any associated headaches.  She has tried Xyzal which helps her very little, montelukast which helps her very little, and Benadryl which does help her somewhat.  She has tried nasal steroids in the past but has developed epistaxis with the use of this medication.  She also has a history of asthma that has developed over the course of the past 2 years manifested for the most part of shortness of breath and some occasional wheezing with very little coughing.  She must use a short  acting bronchodilator prior to the performance of exercise.  Cold air does not appear to precipitate this issue.  When she uses a short acting bronchodilator she is not that impressed that she actually gets any kind of relief.  She does have some sternal chest pain on occasion associated with this asthma.  She also has raspy voice "a lot".  She does not have any classic symptoms of GERD.  She does have chest pain as noted above.  She does drink coffee on most days and has a soda 3 times per week and eats chocolate 3 times per week and does not consume any alcohol.  [name] informs me that she has had 3 courses of systemic steroids and 3 antibiotics from October through December 2021 and has received a an additional antibiotic and systemic steroid in February 2022 for these issues.  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  Past Medical History:  Diagnosis Date   Asthma    Broken arm    Headache    Vaccine for human papilloma virus (HPV) types 6, 11, 16, and 18 administered     Past Surgical History:  Procedure Laterality Date   LAPAROSCOPY N/A 07/22/2020   Procedure: LAPAROSCOPY DIAGNOSTIC;  Surgeon: Malachy Mood, MD;  Location: [facility] ORS;  Service: Gynecology;  Laterality: N/A;   OTHER SURGICAL HISTORY Right 10/2011   Right thumb    Allergies as of 07/23/2021  Reactions   Fruit & Vegetable Daily [nutritional Supplements] Itching   All fruit   Other    Seasonal Allergies     Medication List    albuterol 108 (90 Base) MCG/ACT inhaler Commonly known as: VENTOLIN HFA Inhale 2 puffs into the lungs every 4 (four) hours as needed for wheezing or shortness of breath.   Calcium/D3 Adult Gummies 200-96.6-200 MG-MG-UNIT Chew Generic drug: Calcium-Phosphorus-Vitamin D Chew 1 tablet by mouth daily.   levocetirizine 5 MG tablet Commonly known as: XYZAL Take  5 mg by mouth every evening.   montelukast 10 MG tablet Commonly known as: SINGULAIR Take 10 mg by mouth at bedtime.   Orilissa 150 MG Tabs Generic drug: Elagolix Sodium Take 1 tablet by mouth daily.        Review of systems negative except as noted in HPI / PMHx or noted below:  Review of Systems  Constitutional: Negative.   HENT: Negative.    Eyes: Negative.   Respiratory: Negative.    Cardiovascular: Negative.   Gastrointestinal: Negative.   Genitourinary: Negative.   Musculoskeletal: Negative.   Skin: Negative.   Neurological: Negative.   Endo/Heme/Allergies: Negative.   Psychiatric/Behavioral: Negative.     Family History  Problem Relation Age of Onset   Asthma Mother    Migraines Maternal Grandmother    Depression Maternal Grandmother    Anxiety disorder Maternal Grandmother    Diabetes Maternal Grandmother    Hypertension Maternal Grandmother    Breast cancer Paternal Grandmother        97s   Migraines Maternal Uncle        Had migraines as a teen   ADD / ADHD Maternal Uncle    Breast cancer Other 3   Stomach cancer Other    Colon cancer Other     Social History   Socioeconomic History   Marital status: Single    Spouse name: Not on file   Number of children: Not on file   Years of education: Not on file   Highest education level: Not on file  Occupational History   Not on file  Tobacco Use   Smoking status: Never   Smokeless tobacco: Never  Vaping Use   Vaping Use: Never used  Substance and Sexual Activity   Alcohol use: Yes    Comment: occasionally   Drug use: No   Sexual activity: Yes    Birth control/protection: I.U.D.  Other Topics Concern   Not on file  Social History Narrative   Not on file   Environmental and Social history  Lives in a house with a dry environment, dogs located inside the household, carpet in the bedroom, no plastic on the bed, no plastic on the pillow, no smoking ongoing with inside the household.  She is a  Therapist, art.  Objective:   Vitals:   07/23/21 0906  BP: 102/62  Pulse: 80  Resp: 16  SpO2: 98%   Height: 5' (152.4 cm) Weight: 126 lb 6.4 oz (57.3 kg)  Physical Exam Constitutional:      Appearance: She is not diaphoretic.  HENT:     Head: Normocephalic.     Right Ear: Tympanic membrane, ear canal and external ear normal.     Left Ear: Tympanic membrane, ear canal and external ear normal.     Nose: Septal deviation and mucosal edema present. No rhinorrhea.     Mouth/Throat:     Pharynx: Uvula midline. No oropharyngeal exudate.  Eyes:     Conjunctiva/sclera:  Conjunctivae normal.  Neck:     Thyroid: No thyromegaly.     Trachea: Trachea normal. No tracheal tenderness or tracheal deviation.  Cardiovascular:     Rate and Rhythm: Normal rate and regular rhythm.     Heart sounds: Normal heart sounds, S1 normal and S2 normal. No murmur heard. Pulmonary:     Effort: No respiratory distress.     Breath sounds: Normal breath sounds. No stridor. No wheezing or rales.  Lymphadenopathy:     Head:     Right side of head: No tonsillar adenopathy.     Left side of head: No tonsillar adenopathy.     Cervical: No cervical adenopathy.  Skin:    Findings: No erythema or rash.     Nails: There is no clubbing.  Neurological:     Mental Status: She is alert.    Diagnostics: Allergy skin tests were performed.  She demonstrated hypersensitivity to house dust mite, cat, dog, feathers, horse, mouse, trees, grasses, weeds.  She also demonstrated hypersensitivity to soybean, walnut, almond, hazelnut, pistachio, barley, sweet potato, green pea, navy bean, carrots, celery.  Spirometry was performed and demonstrated an FEV1 of 2.10 @ 72 % of predicted. FEV1/FVC = 0.83.     Results of a chest x-ray obtained 19 March 2021 identifies the following:  The heart size and mediastinal contours are within normal limits. Both lungs are clear. The visualized skeletal structures  are unremarkable.  Results of blood tests obtained 25 May 2021 identifies creatinine 0.7 mg/DL, AST 35 U/L, ALT 47 U/L, hemoglobin 13.9, platelet 215, WBC 5.9   Assessment and Plan:    1. Not well controlled mild persistent asthma   2. Perennial allergic rhinitis   3. Seasonal allergic rhinitis due to pollen   4. Pollen-food allergy, initial encounter   5. Anosmia   6. LPRD (laryngopharyngeal reflux disease)     1.  Allergen avoidance measures -dust mite, cat, dog, other mammals, pollens, foods that give rise to oral cavity reaction  2.  Treat and prevent inflammation:  A. Armonair 232 - 1 inhalation 1 time per day B. OTC Nasacort - 1 spray each nostril 3-7 times per week C. Montelukast 10 mg - 1 tablet 1 time per day  3.  Treat and prevent reflux/LPR/chest pain:  A. Minimize all caffeine consumption B. Omeprazole 40 mg - 1 tablet 1 time per day  4.  If needed:  A. Albuterol HFA - 2 inhalations every 4-6 hours B. Cetirizine 10 mg - 1 tablet 1-2 times per day C. Pataday - 1 drop each eye 1 time per day D. Auvi-Q 0.3, benadryl, MD/ER evaluation for allergic reaction  5.  Consider a course of immunotherapy  6.  Obtain fall flu vaccine in September/October  7.  Obtain blood tests - CBC w/d, IgE  8.  Obtain sinus CT scan for chronic sinusitis and anosmia  9.  Return to clinic in 4 weeks or earlier if problem  [name] has very significant allergies that are giving rise to mucosal inflammation of her airway and we will get her to utilize a plan of action which includes a combination of allergen avoidance measures and anti-inflammatory agents for her airway.  In addition she has oral allergy syndrome and she needs to be very careful about eating foods that give rise to oral cavity reaction.  We did provide her an injectable epinephrine device if she ever develops a significant systemic reaction following exposure to those foods.  She has anosmia  and has been treated with multiple  courses of antibiotics for sinusitis and we will now see if she has chronic sinusitis by obtaining a sinus CT scan.  She has chest pain and symptoms of LPR and we will treat her with omeprazole.  I have ordered some blood test to see if she is a candidate for a biologic agent if she fails medical therapy.  She would also be a candidate for immunotherapy which would help her airway disease and also her oral allergy syndrome.  Jiles Prows, MD Allergy / Immunology [facility]

## 2021-07-27 LAB — CBC WITH DIFFERENTIAL
Basophils Absolute: 0.1 10*3/uL (ref 0.0–0.2)
Basos: 1 %
EOS (ABSOLUTE): 0.2 10*3/uL (ref 0.0–0.4)
Eos: 4 %
Hematocrit: 39.3 % (ref 34.0–46.6)
Hemoglobin: 13.4 g/dL (ref 11.1–15.9)
Immature Grans (Abs): 0 10*3/uL (ref 0.0–0.1)
Immature Granulocytes: 0 %
Lymphocytes Absolute: 2.3 10*3/uL (ref 0.7–3.1)
Lymphs: 35 %
MCH: 28.6 pg (ref 26.6–33.0)
MCHC: 34.1 g/dL (ref 31.5–35.7)
MCV: 84 fL (ref 79–97)
Monocytes Absolute: 0.8 10*3/uL (ref 0.1–0.9)
Monocytes: 12 %
Neutrophils Absolute: 3.2 10*3/uL (ref 1.4–7.0)
Neutrophils: 48 %
RBC: 4.69 x10E6/uL (ref 3.77–5.28)
RDW: 12.2 % (ref 11.7–15.4)
WBC: 6.5 10*3/uL (ref 3.4–10.8)

## 2021-07-27 LAB — IGE: IgE (Immunoglobulin E), Serum: 90 IU/mL (ref 6–495)

## 2021-07-30 ENCOUNTER — Telehealth: Payer: Self-pay

## 2021-07-30 NOTE — Addendum Note (Signed)
Addended by: Guy Franco on: 07/30/2021 12:15 PM   Modules accepted: Orders

## 2021-07-30 NOTE — Telephone Encounter (Signed)
Sinus CT Scan has been ordered for patient. She will get procedure done at [facility]. Pre [id number]. Patient was notified that she needs to call and schedule appointment. Number was provided for patient to call.

## 2021-08-11 ENCOUNTER — Encounter: Payer: Self-pay | Admitting: Obstetrics and Gynecology

## 2021-08-11 ENCOUNTER — Other Ambulatory Visit: Payer: Self-pay

## 2021-08-11 ENCOUNTER — Ambulatory Visit (INDEPENDENT_AMBULATORY_CARE_PROVIDER_SITE_OTHER): Payer: Managed Care, Other (non HMO) | Admitting: Obstetrics and Gynecology

## 2021-08-11 VITALS — BP 110/66 | Ht 60.0 in | Wt 126.0 lb

## 2021-08-11 DIAGNOSIS — N939 Abnormal uterine and vaginal bleeding, unspecified: Secondary | ICD-10-CM

## 2021-08-11 DIAGNOSIS — Z30431 Encounter for routine checking of intrauterine contraceptive device: Secondary | ICD-10-CM

## 2021-08-11 NOTE — Progress Notes (Signed)
Obstetrics & Gynecology Office Visit   Chief Complaint:  Chief Complaint  Patient presents with   Follow-up    IUD string check    History of Present Illness: 20 y.o. patient presenting for follow up of Amy [name] IUD placement  02/20/2021  ago.  The indication for her IUD was cycle control.  Reports an episode of heavier bleeding this month that has since stopped..  Still having some occasional spotting.  is able to feel strings.    Review of Systems: Review of Systems  Constitutional: Negative.   Gastrointestinal: Negative.   Genitourinary: Negative.    Past Medical History:  Past Medical History:  Diagnosis Date   Asthma    Broken arm    Headache    Vaccine for human papilloma virus (HPV) types 6, 11, 16, and 18 administered     Past Surgical History:  Past Surgical History:  Procedure Laterality Date   LAPAROSCOPY N/A 07/22/2020   Procedure: LAPAROSCOPY DIAGNOSTIC;  Surgeon: Malachy Mood, MD;  Location: [facility] ORS;  Service: Gynecology;  Laterality: N/A;   OTHER SURGICAL HISTORY Right 10/2011   Right thumb    Gynecologic History: No LMP recorded. (Menstrual status: IUD).  Obstetric History: G0P0000  Family History:  Family History  Problem Relation Age of Onset   Asthma Mother    Migraines Maternal Grandmother    Depression Maternal Grandmother    Anxiety disorder Maternal Grandmother    Diabetes Maternal Grandmother    Hypertension Maternal Grandmother    Breast cancer Paternal Grandmother        38s   Migraines Maternal Uncle        Had migraines as a teen   ADD / ADHD Maternal Uncle    Breast cancer Other 48   Stomach cancer Other    Colon cancer Other     Social History:  Social History   Socioeconomic History   Marital status: Single    Spouse name: Not on file   Number of children: Not on file   Years of education: Not on file   Highest education level: Not on file  Occupational History   Not on file  Tobacco Use   Smoking status:  Never   Smokeless tobacco: Never  Vaping Use   Vaping Use: Never used  Substance and Sexual Activity   Alcohol use: Yes    Comment: occasionally   Drug use: No   Sexual activity: Yes    Birth control/protection: I.U.D.  Other Topics Concern   Not on file  Social History Narrative   Not on file   Social Determinants of Health   Financial Resource Strain: Not on file  Food Insecurity: Not on file  Transportation Needs: Not on file  Physical Activity: Not on file  Stress: Not on file  Social Connections: Not on file  Intimate Partner Violence: Not on file    Allergies:  Allergies  Allergen Reactions   Fruit & Vegetable Daily [Nutritional Supplements] Itching    All fruit   Other     Seasonal Allergies    Medications: Prior to Admission medications   Medication Sig Start Date End Date Taking? Authorizing Provider  cetirizine (ZYRTEC) 10 MG tablet 1 tablet 1-2 times per day. 07/23/21  Yes Kozlow, Donnamarie Poag, MD  Elagolix Sodium (ORILISSA) 150 MG TABS Take 1 tablet by mouth daily. 04/22/21  Yes Malachy Mood, MD  Fluticasone Propionate,sensor, [facility]) 232 MCG/ACT AEPB 1 inhalation 1 time per day 07/23/21  Yes Kozlow, Donnamarie Poag, MD  montelukast (SINGULAIR) 10 MG tablet Take 10 mg by mouth at bedtime.   Yes [provider]  omeprazole (PRILOSEC) 40 MG capsule Take 1 capsule (40 mg total) by mouth daily. 07/23/21  Yes Kozlow, Donnamarie Poag, MD  sodium fluoride (FLUORISHIELD) 1.1 % GEL dental gel Take by mouth daily. 06/04/21  Yes [provider]  albuterol (VENTOLIN HFA) 108 (90 Base) MCG/ACT inhaler Inhale 2 puffs into the lungs every 4 (four) hours as needed for wheezing or shortness of breath.  01/03/19 07/18/20  [provider]  Calcium-Phosphorus-Vitamin D (CALCIUM/D3 ADULT GUMMIES) 200-96.6-200 MG-MG-UNIT CHEW Chew 1 tablet by mouth daily.     [provider]  EPINEPHrine (AUVI-Q) 0.3 mg/0.3 mL IJ SOAJ injection Inject 0.3 mg into the muscle as  needed for anaphylaxis. As directed for life-threatening allergic reactions 07/23/21   Kozlow, Donnamarie Poag, MD  levocetirizine (XYZAL) 5 MG tablet Take 5 mg by mouth every evening. Patient not taking: Reported on 08/11/2021    [provider]    Physical Exam Blood pressure 110/66, height 5' (1.524 m), weight 126 lb (57.2 kg). No LMP recorded. (Menstrual status: IUD).  General: NAD HEENT: normocephalic, anicteric Pulmonary: No increased work of breathing  Genitourinary:  External: Normal external female genitalia.  Normal urethral meatus, normal  Bartholin's and Skene's glands.    Vagina: Normal vaginal mucosa, no evidence of prolapse.    Cervix: Grossly normal in appearance, no bleeding, IUD strings visualized 2cm  Uterus: Non-enlarged, mobile, normal contour.  No CMT  Adnexa: ovaries non-enlarged, no adnexal masses  Rectal: deferred  Lymphatic: no evidence of inguinal lymphadenopathy Extremities: no edema, erythema, or tenderness Neurologic: Grossly intact Psychiatric: mood appropriate, affect full  Female chaperone present for pelvic and breast  portions of the physical exam  Assessment: 20 y.o. G0P0000 medication follow up  Plan: Problem List Items Addressed This Visit   None Visit Diagnoses     Abnormal uterine bleeding    -  Primary   Relevant Orders   US PELVIS TRANSVAGINAL NON-OB (TV ONLY)   IUD check up            1.  The patient was given instructions to check her IUD strings monthly and call with any problems or concerns.  She should call for fevers, chills, abnormal vaginal discharge, pelvic pain, or other complaints.  2.   IUDs while effective at preventing pregnancy do not prevent transmission of sexually transmitted diseases and use of barrier methods for this purpose was discussed.  Low overall incidence of failure with 99.7% efficacy rate in typical use.  The patient has not contraindication to IUD placement.  3.  Strings visualized today, no evidence  of expulsion.  No active bleeding on exam or old blood noted.    4) A total of 15 minutes were spent in face-to-face contact with the patient during this encounter with over half of that time devoted to counseling and coordination of care.  5) Return for after [place] imaging results.   Malachy Mood, MD, [facility], [facility] 08/11/2021, 3:15 PM

## 2021-08-17 ENCOUNTER — Ambulatory Visit: Payer: Managed Care, Other (non HMO)

## 2021-08-20 ENCOUNTER — Ambulatory Visit: Payer: Managed Care, Other (non HMO) | Admitting: Allergy and Immunology

## 2021-08-27 ENCOUNTER — Other Ambulatory Visit: Payer: Self-pay | Admitting: Obstetrics and Gynecology

## 2021-08-27 DIAGNOSIS — N939 Abnormal uterine and vaginal bleeding, unspecified: Secondary | ICD-10-CM

## 2021-08-27 NOTE — Telephone Encounter (Signed)
Order is in which ever one can get her in is fine

## 2021-08-27 NOTE — Progress Notes (Signed)
4000 

## 2021-08-31 ENCOUNTER — Ambulatory Visit
Admission: RE | Admit: 2021-08-31 | Discharge: 2021-08-31 | Disposition: A | Discharge status: Home / Self Care | Payer: Managed Care, Other (non HMO) | Source: Ambulatory Visit | Attending: Obstetrics and Gynecology | Admitting: Obstetrics and Gynecology

## 2021-08-31 ENCOUNTER — Other Ambulatory Visit: Payer: Self-pay

## 2021-08-31 DIAGNOSIS — N939 Abnormal uterine and vaginal bleeding, unspecified: Secondary | ICD-10-CM | POA: Insufficient documentation

## 2021-09-02 ENCOUNTER — Ambulatory Visit
Admission: RE | Admit: 2021-09-02 | Discharge: 2021-09-02 | Disposition: A | Discharge status: Home / Self Care | Payer: Managed Care, Other (non HMO) | Source: Ambulatory Visit | Attending: Allergy and Immunology | Admitting: Allergy and Immunology

## 2021-09-02 ENCOUNTER — Encounter: Payer: Self-pay | Admitting: Allergy and Immunology

## 2021-09-02 ENCOUNTER — Other Ambulatory Visit: Payer: Self-pay

## 2021-09-02 ENCOUNTER — Ambulatory Visit (INDEPENDENT_AMBULATORY_CARE_PROVIDER_SITE_OTHER): Payer: Managed Care, Other (non HMO) | Admitting: Allergy and Immunology

## 2021-09-02 VITALS — BP 96/60 | HR 86 | Resp 16

## 2021-09-02 DIAGNOSIS — J4530 Mild persistent asthma, uncomplicated: Secondary | ICD-10-CM

## 2021-09-02 DIAGNOSIS — J3089 Other allergic rhinitis: Secondary | ICD-10-CM

## 2021-09-02 DIAGNOSIS — R430 Anosmia: Secondary | ICD-10-CM | POA: Diagnosis present

## 2021-09-02 DIAGNOSIS — J301 Allergic rhinitis due to pollen: Secondary | ICD-10-CM

## 2021-09-02 DIAGNOSIS — T781XXA Other adverse food reactions, not elsewhere classified, initial encounter: Secondary | ICD-10-CM

## 2021-09-02 DIAGNOSIS — K219 Gastro-esophageal reflux disease without esophagitis: Secondary | ICD-10-CM

## 2021-09-02 DIAGNOSIS — J329 Chronic sinusitis, unspecified: Secondary | ICD-10-CM | POA: Diagnosis present

## 2021-09-02 MED ORDER — ARMONAIR DIGIHALER 232 MCG/ACT IN AEPB: INHALATION_SPRAY | RESPIRATORY_TRACT | 1 refills | Status: DC

## 2021-09-02 NOTE — Patient Instructions (Signed)
  1.  Allergen avoidance measures -dust mite, cat, dog, other mammals, pollens, foods that give rise to oral cavity reaction  2.  Continue to treat and prevent inflammation:  A. Armonair 232 - 1 inhalation 3-7 times per week B. OTC Nasacort - 1 spray each nostril 3-7 times per week C. Montelukast 10 mg - 1 tablet 1 time per day  3.  Continue to treat and prevent reflux/LPR/chest pain:  A. Minimize all caffeine consumption B. Omeprazole 40 mg - 1 tablet 1 time per day  4.  If needed:  A. Albuterol HFA - 2 inhalations every 4-6 hours B. Cetirizine 10 mg - 1 tablet 1-2 times per day C. Pataday - 1 drop each eye 1 time per day D. Auvi-Q 0.3, benadryl, MD/ER evaluation for allergic reaction  5.  Consider a course of immunotherapy  6.  Obtain fall flu vaccine in September/October  7.  Chronic sinusitis treatment?  Depends on CT scan results  8.  Return to clinic in 12 weeks or earlier if problem

## 2021-09-02 NOTE — Progress Notes (Signed)
[facility] [place] [facility] [place]   Follow-up Note  Referring Provider: Wayland Denis, PA-C Primary Provider: Wayland Denis, PA-C Date of Office Visit: 09/02/2021  Subjective:   Amy [name] (DOB: 2001/06/29) is a 20 y.o. female who returns to the [facility] on 09/02/2021 in re-evaluation of the following:  HPI: Amy [name] returns to this clinic in evaluation of her multiorgan atopic disease including asthma, allergic rhinitis, oral allergy syndrome, and a history of anosmia and LPR.  Her last visit to this clinic was her initial evaluation of 23 July 2021.  She is doing much better regarding her airway.  Her breathing is better and she does not need to use a short acting bronchodilator and she can exert herself better.  She continues on inhaled steroid.  Likewise, she has had very little problems with her nose at this point in time.  But she still cannot smell.  She continues on montelukast and a nasal steroid.  CT scan of sinuses is scheduled for today.  Her recurrent raspy voice and chest pain has resolved.  She continues on a proton pump inhibitor.  She still consumes caffeine.  Allergies as of 09/02/2021       Reactions   Fruit & Vegetable Daily [nutritional Supplements] Itching   All fruit   Other    Seasonal Allergies        Medication List    albuterol 108 (90 Base) MCG/ACT inhaler Commonly known as: VENTOLIN HFA Inhale 2 puffs into the lungs every 4 (four) hours as needed for wheezing or shortness of breath.   ArmonAir Digihaler 232 MCG/ACT Aepb Generic drug: Fluticasone Propionate(sensor) 1 inhalation 1 time per day   Calcium/D3 Adult Gummies 200-96.6-200 MG-MG-UNIT Chew Generic drug: Calcium-Phosphorus-Vitamin D Chew 1 tablet by mouth daily.   cetirizine 10 MG tablet Commonly known as: ZYRTEC 1 tablet 1-2 times per day.   EPINEPHrine 0.3 mg/0.3 mL Soaj injection Commonly known as: Auvi-Q Inject 0.3 mg into  the muscle as needed for anaphylaxis. As directed for life-threatening allergic reactions   montelukast 10 MG tablet Commonly known as: SINGULAIR Take 10 mg by mouth at bedtime.   omeprazole 40 MG capsule Commonly known as: PRILOSEC Take 1 capsule (40 mg total) by mouth daily.   Orilissa 150 MG Tabs Generic drug: Elagolix Sodium Take 1 tablet by mouth daily.   sodium fluoride 1.1 % Gel dental gel Commonly known as: FLUORISHIELD Take by mouth daily.    Past Medical History:  Diagnosis Date   Asthma    Broken arm    Headache    Vaccine for human papilloma virus (HPV) types 6, 11, 16, and 18 administered     Past Surgical History:  Procedure Laterality Date   LAPAROSCOPY N/A 07/22/2020   Procedure: LAPAROSCOPY DIAGNOSTIC;  Surgeon: Malachy Mood, MD;  Location: [facility] ORS;  Service: Gynecology;  Laterality: N/A;   OTHER SURGICAL HISTORY Right 10/2011   Right thumb    Review of systems negative except as noted in HPI / PMHx or noted below:  Review of Systems  Constitutional: Negative.   HENT: Negative.    Eyes: Negative.   Respiratory: Negative.    Cardiovascular: Negative.   Gastrointestinal: Negative.   Genitourinary: Negative.   Musculoskeletal: Negative.   Skin: Negative.   Neurological: Negative.   Endo/Heme/Allergies: Negative.   Psychiatric/Behavioral: Negative.      Objective:   Vitals:   09/02/21 1037  BP: 96/60  Pulse: 86  Resp: 16  SpO2: 98%          Physical Exam Constitutional:      Appearance: She is not diaphoretic.  HENT:     Head: Normocephalic.     Right Ear: Tympanic membrane, ear canal and external ear normal.     Left Ear: Tympanic membrane, ear canal and external ear normal.     Nose: Nose normal. No mucosal edema or rhinorrhea.     Mouth/Throat:     Pharynx: Uvula midline. No oropharyngeal exudate.  Eyes:     Conjunctiva/sclera: Conjunctivae normal.  Neck:     Thyroid: No thyromegaly.     Trachea: Trachea normal. No  tracheal tenderness or tracheal deviation.  Cardiovascular:     Rate and Rhythm: Normal rate and regular rhythm.     Heart sounds: Normal heart sounds, S1 normal and S2 normal. No murmur heard. Pulmonary:     Effort: No respiratory distress.     Breath sounds: Normal breath sounds. No stridor. No wheezing or rales.  Lymphadenopathy:     Head:     Right side of head: No tonsillar adenopathy.     Left side of head: No tonsillar adenopathy.     Cervical: No cervical adenopathy.  Skin:    Findings: No erythema or rash.     Nails: There is no clubbing.  Neurological:     Mental Status: She is alert.    Diagnostics:    Spirometry was performed and demonstrated an FEV1 of 1.94 at 67 % of predicted.  Assessment and Plan:   1. Asthma, well controlled, mild persistent   2. Perennial allergic rhinitis   3. Seasonal allergic rhinitis due to pollen   4. Pollen-food allergy, initial encounter   5. Anosmia   6. LPRD (laryngopharyngeal reflux disease)     1.  Allergen avoidance measures -dust mite, cat, dog, other mammals, pollens, foods that give rise to oral cavity reaction  2.  Continue to treat and prevent inflammation:  A. Armonair 232 - 1 inhalation 3-7 times per week B. OTC Nasacort - 1 spray each nostril 3-7 times per week C. Montelukast 10 mg - 1 tablet 1 time per day  3.  Continue to treat and prevent reflux/LPR/chest pain:  A. Minimize all caffeine consumption B. Omeprazole 40 mg - 1 tablet 1 time per day  4.  If needed:  A. Albuterol HFA - 2 inhalations every 4-6 hours B. Cetirizine 10 mg - 1 tablet 1-2 times per day C. Pataday - 1 drop each eye 1 time per day D. Auvi-Q 0.3, benadryl, MD/ER evaluation for allergic reaction  5.  Consider a course of immunotherapy  6.  Obtain fall flu vaccine in September/October  7.  Chronic sinusitis treatment?  Depends on CT scan results  8.  Return to clinic in 12 weeks or earlier if problem  Amy [name] appears to be doing better  and you I am going to allow her to decide the dose of inhaled steroid and nasal steroid that works best for her aiming for the least amount of medication required to obtain this effect.  And she will continue to treat reflux as noted above.  I will see her back in this clinic at the end of this year.  If she has difficulty she will contact me and we will consider a different plan.  I will contact her with the results of her sinus CT scan performed in evaluation of anosmia once it is available for  review.  Allena Katz, MD Allergy / Immunology [place]

## 2021-09-03 ENCOUNTER — Encounter: Payer: Self-pay | Admitting: Allergy and Immunology

## 2021-09-07 ENCOUNTER — Ambulatory Visit: Payer: Managed Care, Other (non HMO) | Admitting: Obstetrics and Gynecology

## 2021-09-14 ENCOUNTER — Telehealth: Payer: Self-pay

## 2021-09-14 MED ORDER — ORILISSA 150 MG PO TABS
1.0000 | ORAL_TABLET | Freq: Every day | ORAL | 6 refills | Status: DC
Start: 2021-09-14 — End: 2023-01-10

## 2021-09-14 NOTE — Telephone Encounter (Signed)
Pt calling; needs pharm changed from [facility] to [facility]. Ch at [facility].  After this week she will be out of pills.  [phone] [place] changed; has refills; just contact pharm.

## 2021-09-15 NOTE — Telephone Encounter (Signed)
Pt returned call; adv pharm is changed and she has refills on file to contact pharm for refill.

## 2021-09-21 ENCOUNTER — Other Ambulatory Visit: Payer: Self-pay | Admitting: *Deleted

## 2021-09-21 MED ORDER — AMOXICILLIN-POT CLAVULANATE 875-125 MG PO TABS: ORAL_TABLET | ORAL | 0 refills | Status: DC

## 2021-09-30 ENCOUNTER — Ambulatory Visit: Payer: Managed Care, Other (non HMO) | Admitting: Obstetrics and Gynecology

## 2021-10-02 IMAGING — US US PELVIS COMPLETE WITH TRANSVAGINAL
1 series · 14 of 25 positions shown · non-contrast
Comparison: None

CLINICAL DATA: Pelvic pain.



[Series 1: us pelvis complete with transvaginal · 0.17mm/px · 106 acquisitions, 14 frames shown]
[im 1/106]
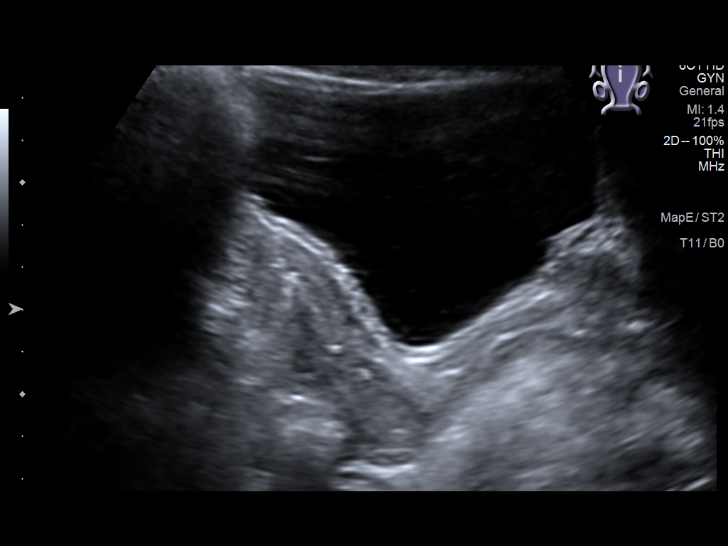
[im 9/106]
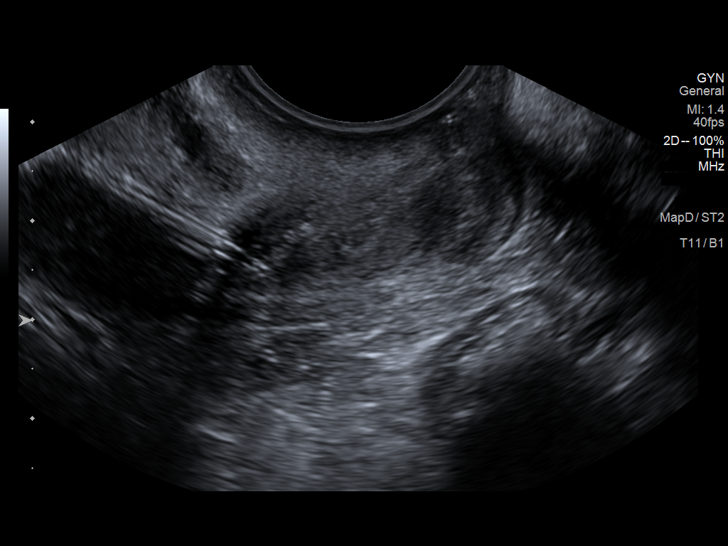
[im 18/106]
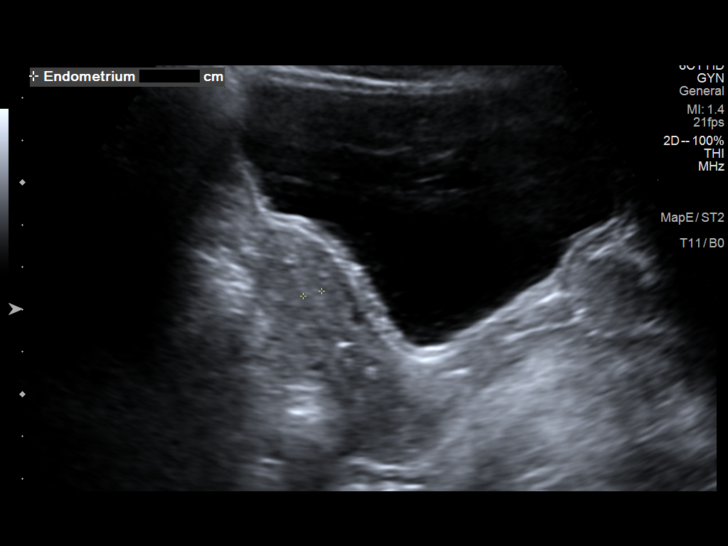
[im 27/106]
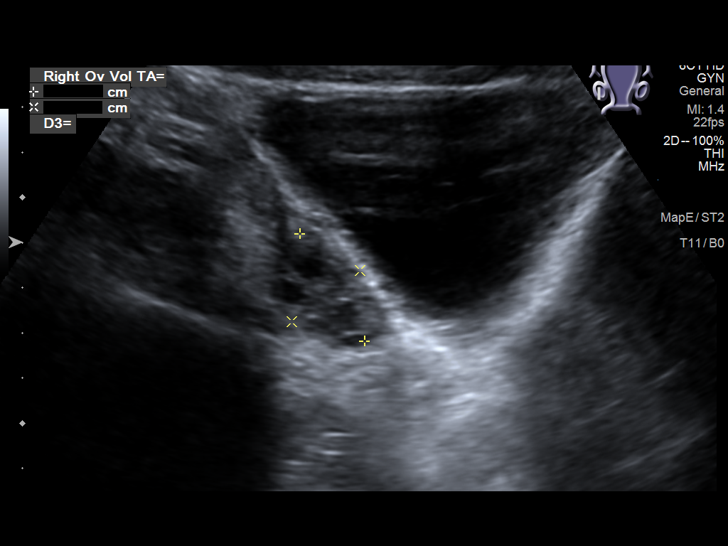
[im 36/106]
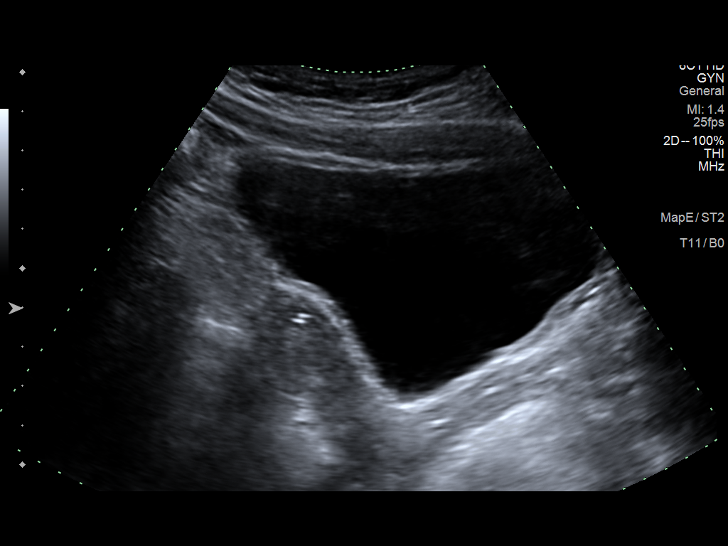
[im 40/106]
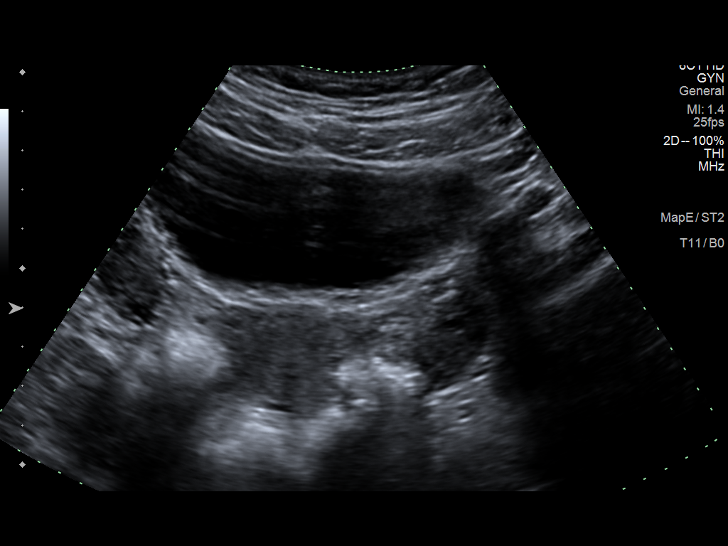
[im 49/106]
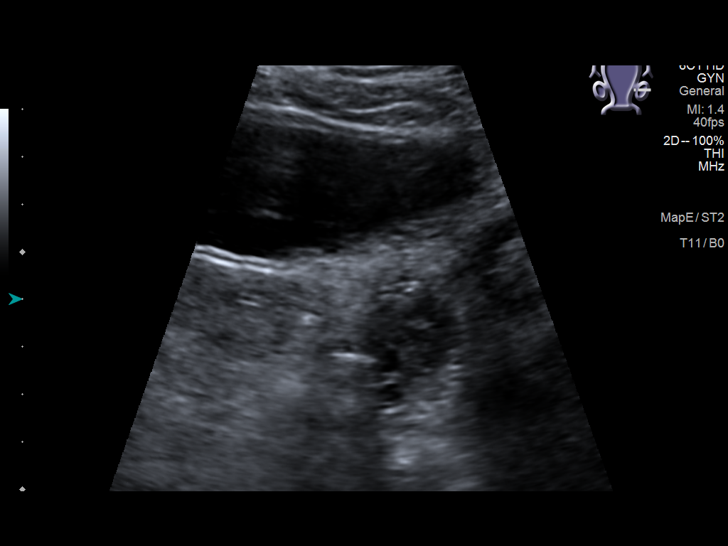
[im 57/106]
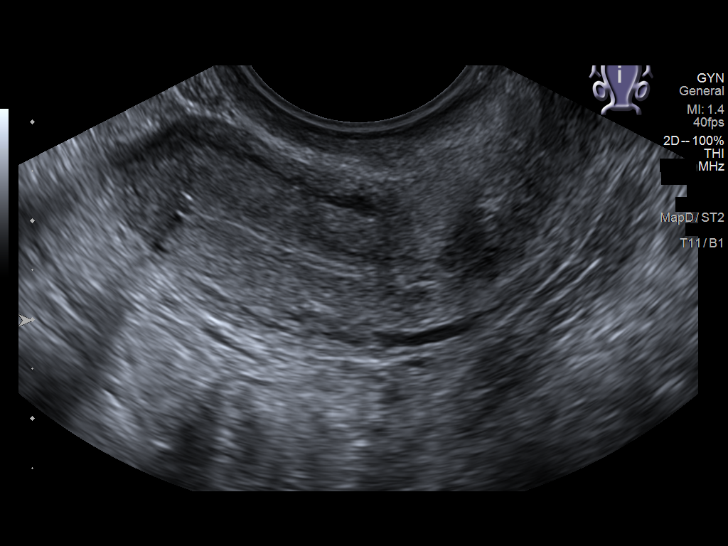
[im 66/106]
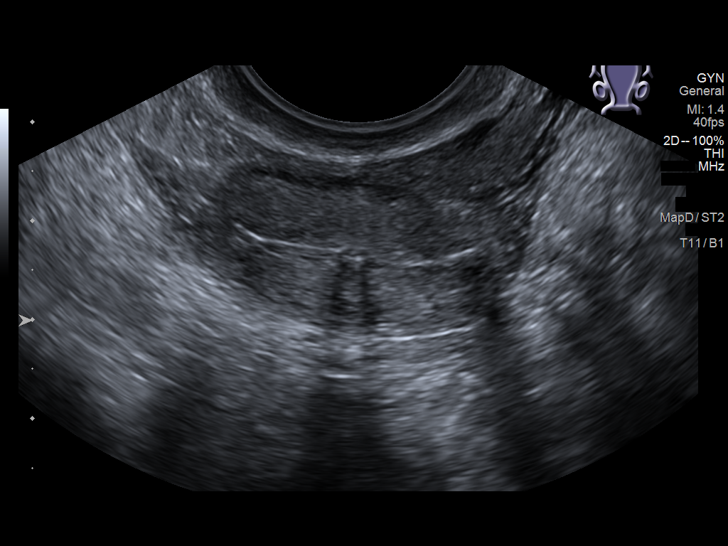
[im 71/106]
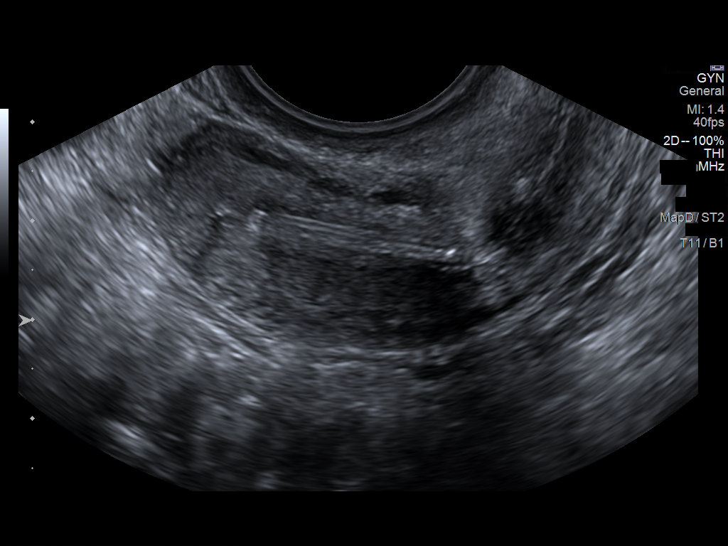
[im 79/106]
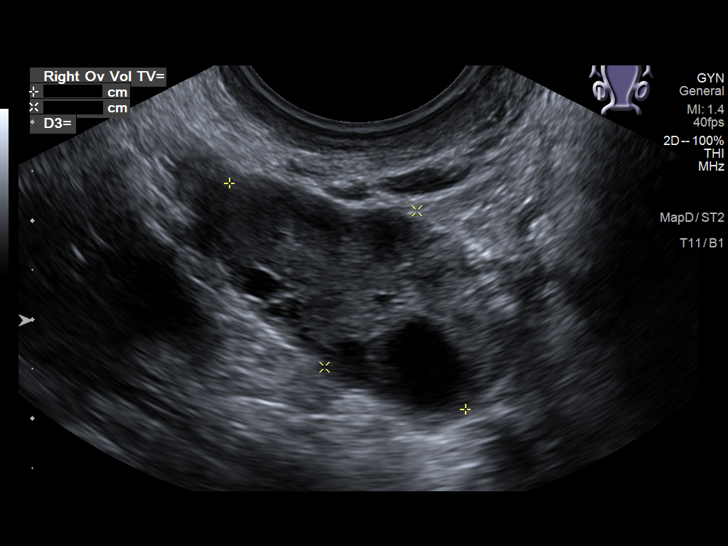
[im 88/106]
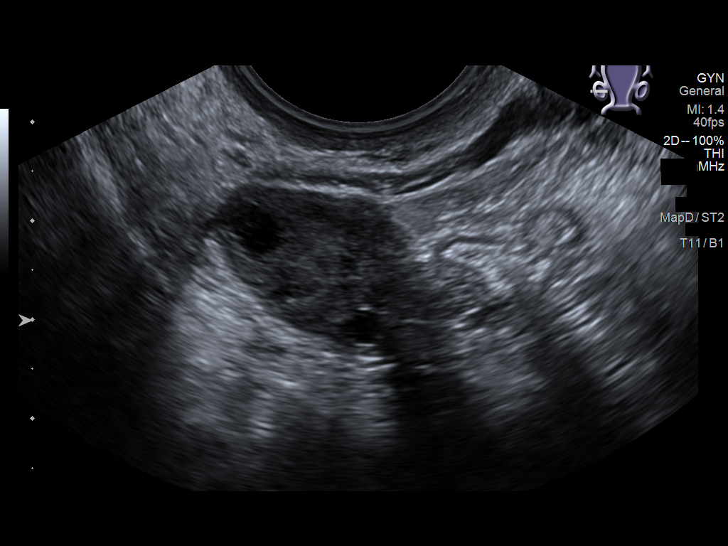
[im 97/106]
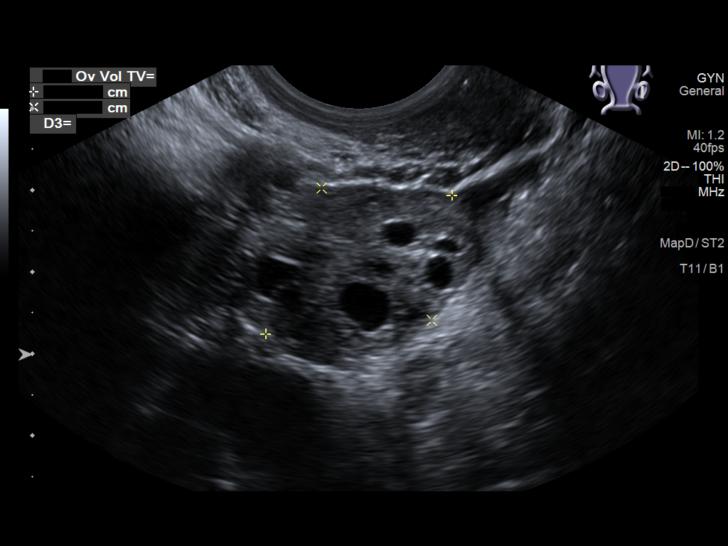
[im 106/106]
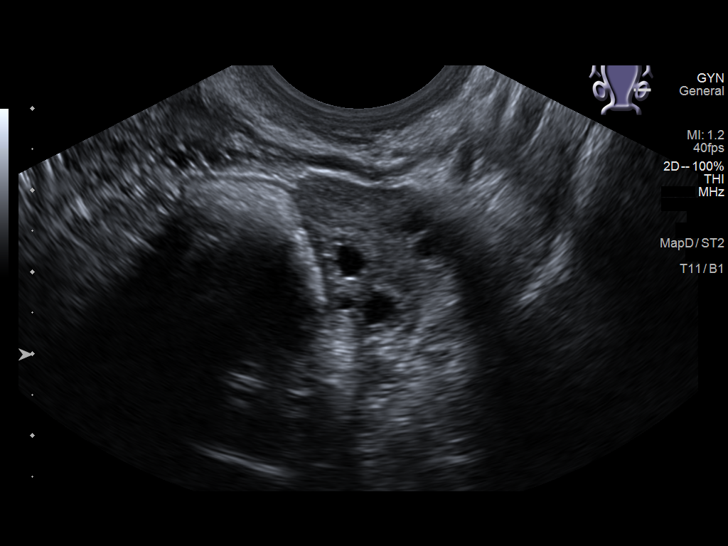

[14 of 25 positions shown; findings below may reference images not displayed]

FINDINGS: Uterus

Measurements: 4.8 cm x 2.0 cm x 3.6 cm = volume: 18.5 mL. No
fibroids or other mass visualized.

Endometrium

Thickness: 2.4 mm. An IUD is in place. No focal abnormality
visualized.

Right ovary

Measurements: 3.3 cm x 1.8 cm x 1.8 cm = volume: 5.7 mL. Normal
appearance/no adnexal mass.

Left ovary

Measurements: 2.8 cm x 2.1 cm x 1.9 cm = volume: 6.0 mL. Normal
appearance/no adnexal mass.

Other findings

A trace amount of pelvic free fluid is seen
IMPRESSION: 1. IUD in place.
2. Otherwise, unremarkable pelvic ultrasound.

## 2021-10-04 IMAGING — CT CT MAXILLOFACIAL W/O CM
3 of 5 series · 14 of 47 positions shown, 16 images · non-contrast
Comparison: None

CLINICAL DATA: Chronic sinusitis. Anosmia. Additional history
provided by scanning technologist: Patient reports poor sense of
smell for approximately 15 years, history of COVID-07 August, 2020

EXAM:
CT MAXILLOFACIAL WITHOUT CONTRAST
TECHNIQUE: Multidetector CT images of the paranasal sinuses were obtained using
the standard protocol without intravenous contrast.

[Series 6: sinus 2.00 cor · coronal · 0.21mm/px · 3 of 65 slices shown]
[im 22/65  bone]
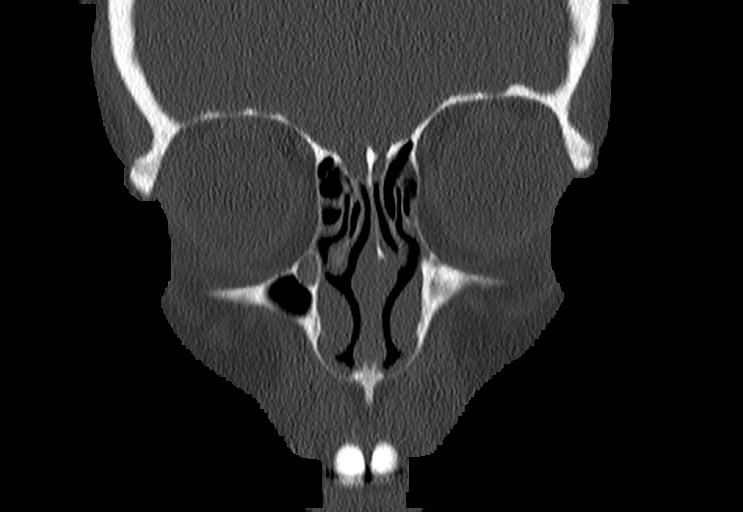
[im 29/65  bone]
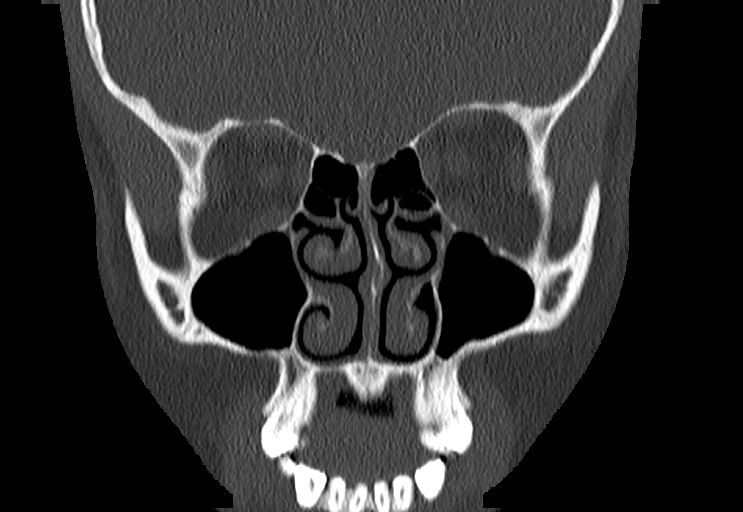
[im 36/65  bone]
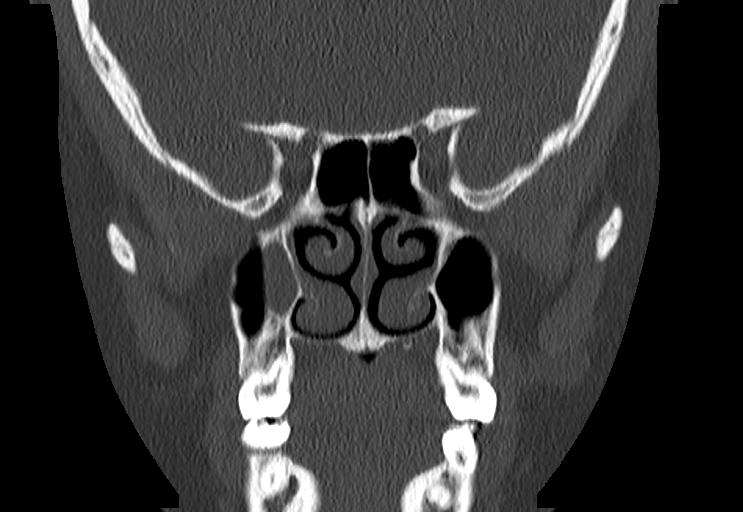

[Series 8: sinus 2.00 sag · sagittal · 0.21mm/px · 3 of 77 slices shown]
[im 26/77  bone]
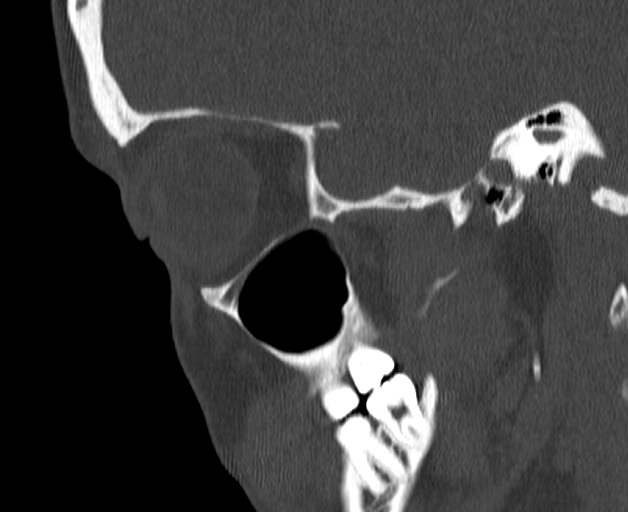
[im 39/77  bone]
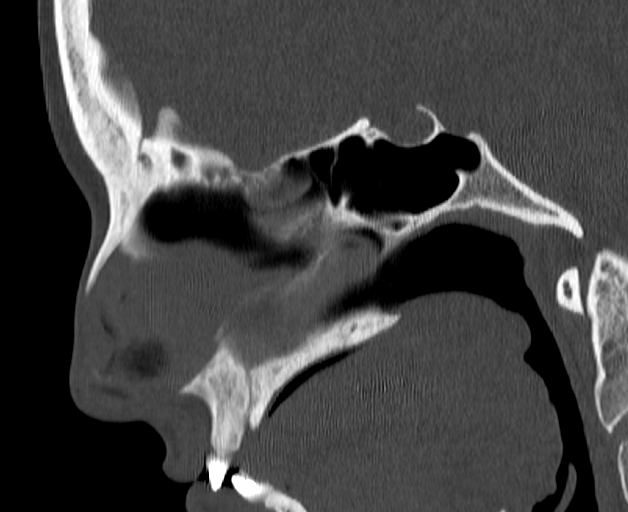
[im 51/77  bone]
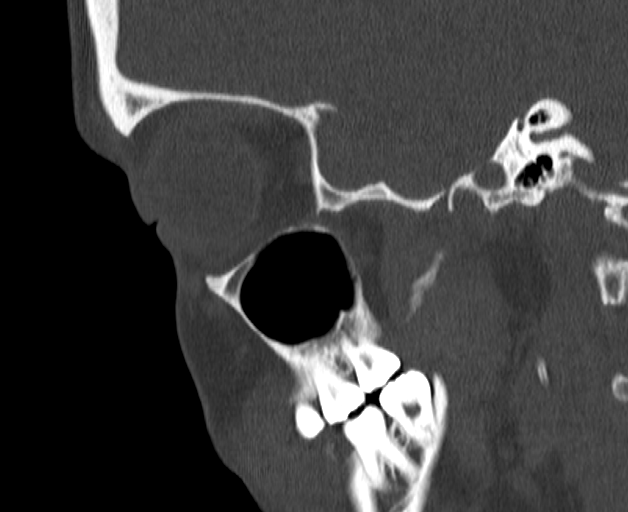

[Series 10: thins sinus 1.00 · axial · 0.32mm/px · z∈[-615,-528]mm · 8 of 103 slices shown, 10 images]
[im 8/103  brain]
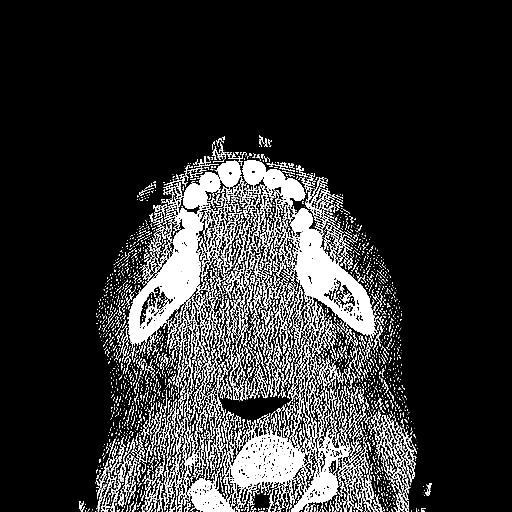
[im 8/103  bone]
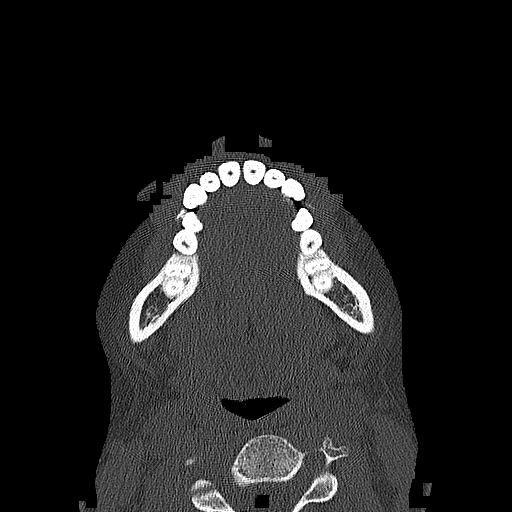
[im 22/103  bone]
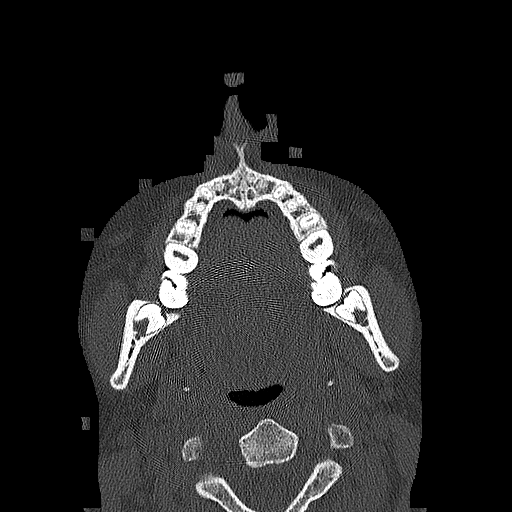
[im 37/103  bone]
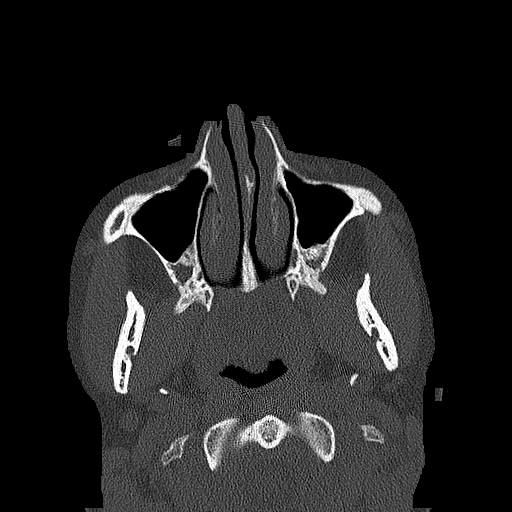
[im 44/103  bone]
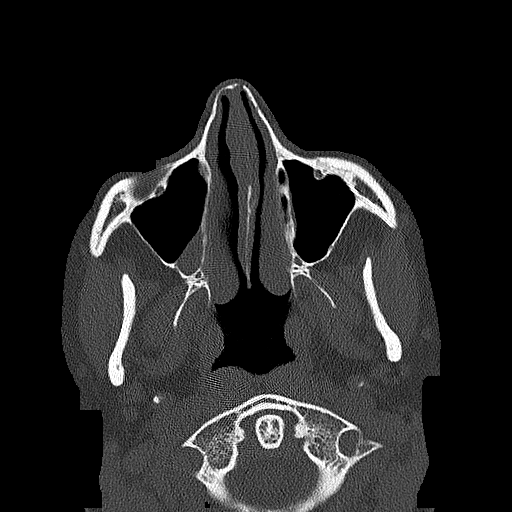
[im 59/103  brain]
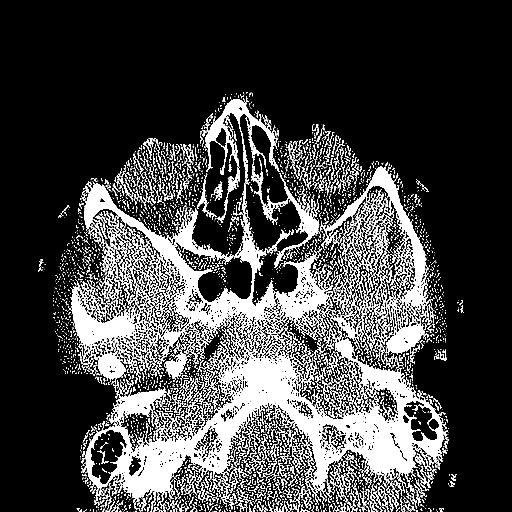
[im 59/103  bone]
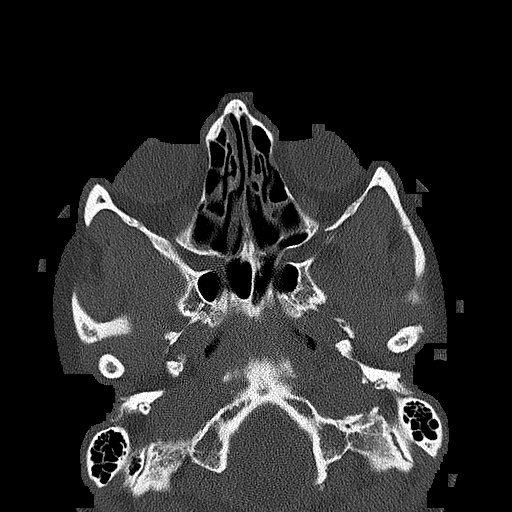
[im 66/103  bone]
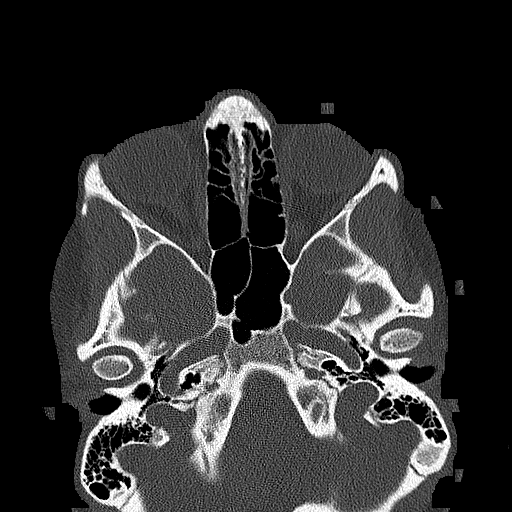
[im 81/103  bone]
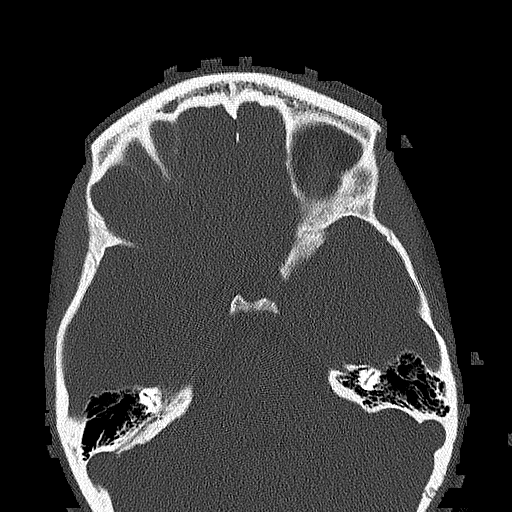
[im 95/103  bone]
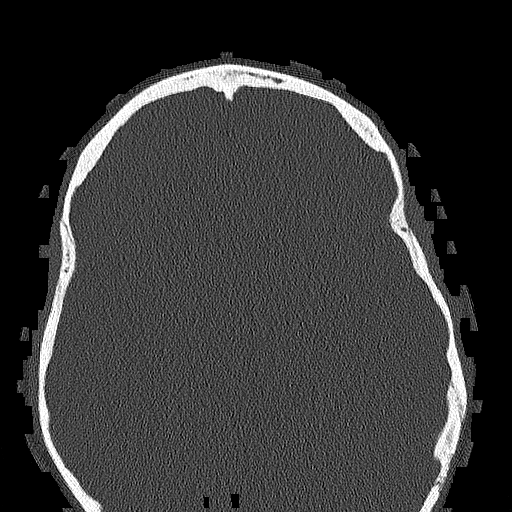

[14 of 47 positions shown; findings below may reference images not displayed]

FINDINGS: Paranasal sinuses:

Frontal: Hypoplastic frontal sinuses.

Ethmoid: Trace mucosal thickening versus fluid within the bilateral
ethmoid air cells.

Maxillary: Mild mucosal thickening within the posterior aspect of
the right maxillary sinus. Trace mucosal thickening within the
superomedial left maxillary sinus.

Sphenoid: Normally aerated. Patent sphenoethmoidal recesses.

Right ostiomeatal unit: Patent.

Left ostiomeatal unit: Patent.

Nasal passages: Patent. Leftward deviation of the bony nasal septum.
Pneumatization of the middle turbinate vertical lamellae
bilaterally.

Anatomy: No pneumatization superior to anterior ethmoid notches.
Symmetric and intact olfactory grooves and fovea ethmoidalis, Keros
II (4-7mm). Sellar sphenoid pneumatization pattern.
IMPRESSION: Hypoplastic frontal sinuses.

Trace scattered mucosal thickening versus fluid within the bilateral
ethmoid air cells.

Mucosal thickening within the bilateral maxillary sinuses (mild on
the right, trace on the left).

The paranasal sinuses are otherwise normally aerated. Patent sinus
drainage pathways.

Leftward deviation of the bony nasal septum.

## 2021-10-08 NOTE — Telephone Encounter (Signed)
Kyleena rcvd/charged 02/20/21

## 2021-10-13 ENCOUNTER — Other Ambulatory Visit: Payer: Self-pay

## 2021-10-13 ENCOUNTER — Ambulatory Visit (INDEPENDENT_AMBULATORY_CARE_PROVIDER_SITE_OTHER): Payer: Managed Care, Other (non HMO) | Admitting: Obstetrics and Gynecology

## 2021-10-13 ENCOUNTER — Encounter: Payer: Self-pay | Admitting: Obstetrics and Gynecology

## 2021-10-13 VITALS — BP 100/58 | Ht 61.0 in | Wt 123.0 lb

## 2021-10-13 DIAGNOSIS — N809 Endometriosis, unspecified: Secondary | ICD-10-CM

## 2021-10-13 NOTE — Progress Notes (Signed)
Obstetrics & Gynecology Office Visit   Chief Complaint:  Chief Complaint  Patient presents with   Follow-up    Medication follow up. RM 6    History of Present Illness: 20 y.o. G0P0000 presenting for medication follow up for a diagnosis of  endometriosis .  She is currently being managed with  Mirena IUD .   The patient reports good control of symptoms on her current regimen.  On her current medication regimen has intermittent breakthrough bleeding.   She has not noted any side-effects or new symptoms.    Review of Systems: Review of Systems  Constitutional: Negative.   Gastrointestinal: Negative.   Genitourinary: Negative.     Past Medical History:  Past Medical History:  Diagnosis Date   Asthma    Broken arm    Headache    Vaccine for human papilloma virus (HPV) types 6, 11, 16, and 18 administered     Past Surgical History:  Past Surgical History:  Procedure Laterality Date   LAPAROSCOPY N/A 07/22/2020   Procedure: LAPAROSCOPY DIAGNOSTIC;  Surgeon: Malachy Mood, MD;  Location: [facility] ORS;  Service: Gynecology;  Laterality: N/A;   OTHER SURGICAL HISTORY Right 10/2011   Right thumb    Gynecologic History: No LMP recorded. (Menstrual status: IUD).  Obstetric History: G0P0000  Family History:  Family History  Problem Relation Age of Onset   Asthma Mother    Migraines Maternal Grandmother    Depression Maternal Grandmother    Anxiety disorder Maternal Grandmother    Diabetes Maternal Grandmother    Hypertension Maternal Grandmother    Breast cancer Paternal Grandmother        39s   Migraines Maternal Uncle        Had migraines as a teen   ADD / ADHD Maternal Uncle    Breast cancer Other 25   Stomach cancer Other    Colon cancer Other     Social History:  Social History   Socioeconomic History   Marital status: Single    Spouse name: Not on file   Number of children: Not on file   Years of education: Not on file   Highest education level: Not on  file  Occupational History   Not on file  Tobacco Use   Smoking status: Never   Smokeless tobacco: Never  Vaping Use   Vaping Use: Never used  Substance and Sexual Activity   Alcohol use: Yes    Comment: occasionally   Drug use: No   Sexual activity: Yes    Birth control/protection: I.U.D.  Other Topics Concern   Not on file  Social History Narrative   Not on file   Social Determinants of Health   Financial Resource Strain: Not on file  Food Insecurity: Not on file  Transportation Needs: Not on file  Physical Activity: Not on file  Stress: Not on file  Social Connections: Not on file  Intimate Partner Violence: Not on file    Allergies:  Allergies  Allergen Reactions   Fruit & Vegetable Daily [Nutritional Supplements] Itching    All fruit   Other     Seasonal Allergies    Medications: Prior to Admission medications   Medication Sig Start Date End Date Taking? Authorizing Provider  amoxicillin-clavulanate (AUGMENTIN) 875-125 MG tablet Take one tablet twice daily for 20 days. 09/21/21  Yes Kozlow, Donnamarie Poag, MD  Calcium-Phosphorus-Vitamin D (CALCIUM/D3 ADULT GUMMIES) 200-96.6-200 MG-MG-UNIT CHEW Chew 1 tablet by mouth daily.    Yes  [provider]  cetirizine (ZYRTEC) 10 MG tablet 1 tablet 1-2 times per day. 07/23/21  Yes Kozlow, Donnamarie Poag, MD  Elagolix Sodium (ORILISSA) 150 MG TABS Take 1 tablet by mouth daily. 09/14/21  Yes Malachy Mood, MD  Fluticasone Propionate,sensor, [facility]) 232 MCG/ACT AEPB 1 inhalation 1 time per day 09/02/21  Yes Kozlow, Donnamarie Poag, MD  montelukast (SINGULAIR) 10 MG tablet Take 10 mg by mouth at bedtime.   Yes [provider]  omeprazole (PRILOSEC) 40 MG capsule Take 1 capsule (40 mg total) by mouth daily. 07/23/21  Yes Kozlow, Donnamarie Poag, MD  sodium fluoride (FLUORISHIELD) 1.1 % GEL dental gel Take by mouth daily. 06/04/21  Yes [provider]  albuterol (VENTOLIN HFA) 108 (90 Base) MCG/ACT inhaler Inhale 2 puffs into  the lungs every 4 (four) hours as needed for wheezing or shortness of breath.  01/03/19 07/18/20  [provider]  EPINEPHrine (AUVI-Q) 0.3 mg/0.3 mL IJ SOAJ injection Inject 0.3 mg into the muscle as needed for anaphylaxis. As directed for life-threatening allergic reactions 07/23/21   Kozlow, Donnamarie Poag, MD    Physical Exam Vitals:  Vitals:   10/13/21 1011  BP: (!) 100/58   No LMP recorded. (Menstrual status: IUD).  General: NAD, well nourished appears stated age 41: normocephalic, anicteric Thyroid: no enlargement, no palpable nodules Pulmonary: No increased work of breathing Cardiovascular: RRR, distal pulses 2+ Extremities: no edema, erythema, or tenderness Neurologic: Grossly intact Psychiatric: mood appropriate, affect full  Female chaperone present for pelvic  portions of the physical exam  Imaging 08/31/2021  CLINICAL DATA:  Pelvic pain.   EXAM: TRANSABDOMINAL AND TRANSVAGINAL ULTRASOUND OF PELVIS   TECHNIQUE: Both transabdominal and transvaginal ultrasound examinations of the pelvis were performed. Transabdominal technique was performed for global imaging of the pelvis including uterus, ovaries, adnexal regions, and pelvic cul-de-sac. It was necessary to proceed with endovaginal exam following the transabdominal exam to visualize the endometrium.   COMPARISON:  None   FINDINGS: Uterus   Measurements: 4.8 cm x 2.0 cm x 3.6 cm = volume: 18.5 mL. No fibroids or other mass visualized.   Endometrium   Thickness: 2.4 mm. An IUD is in place. No focal abnormality visualized.   Right ovary   Measurements: 3.3 cm x 1.8 cm x 1.8 cm = volume: 5.7 mL. Normal appearance/no adnexal mass.   Left ovary   Measurements: 2.8 cm x 2.1 cm x 1.9 cm = volume: 6.0 mL. Normal appearance/no adnexal mass.   Other findings   A trace amount of pelvic free fluid is seen   IMPRESSION: 1. IUD in place. 2. Otherwise, unremarkable pelvic ultrasound.  No results  found.  Assessment: 20 y.o. G0P0000 follow up endometriosis  Plan: Problem List Items Addressed This Visit       Other   Endometriosis determined by laparoscopy - Primary   1) Endometriosis - Doing well with IUD stopped bleeding has had some breakthrough bleeding today - Discussed Freida Busman should symptoms worsen  2) Healthcare maintenance - pap next year  3) A total of 15 minutes were spent in face-to-face contact with the patient during this encounter with over half of that time devoted to counseling and coordination of care.  4) Return in about 7 months (around 05/13/2022) for annual sometime after 05/11/22.   Malachy Mood, MD, [facility], [place]

## 2021-10-23 ENCOUNTER — Ambulatory Visit: Admit: 2021-10-23 | Payer: Managed Care, Other (non HMO)

## 2021-11-02 NOTE — Progress Notes (Signed)
Chief Complaint  Patient presents with   IUD removal    Due to BTB, interested in new [facility] not sure which method     History of Present Illness:  Amy [name] is a 20 y.o. that had a  Kyleena  IUD placed 3/22 for cycle control/BC/endometriosis, confirmed on dx lap with Dr. Georgianne Fick 8/21. She didn't have bleeding the first few months with IUD, but has been bleeding since 8/22 off and on. Bleeding can last several days to 2 wks, light flow, improved dysmen from before IUD but still significant. Pt also on orilissa 150 mg daily for endometriosis. Still has dyspareunia.  Would like IUD removed and to go back to depo. Had worse dysmen with depo/orilissa combo compared to IUD, but wants to proceed that way anyway.  Pt due for annual 7/22, pt was told had to wait till 21 when called for appt (?).   Past Medical History:  Diagnosis Date   Asthma    Broken arm    Headache    Vaccine for human papilloma virus (HPV) types 6, 11, 16, and 18 administered    Past Surgical History:  Procedure Laterality Date   LAPAROSCOPY N/A 07/22/2020   Procedure: LAPAROSCOPY DIAGNOSTIC;  Surgeon: Malachy Mood, MD;  Location: [facility] ORS;  Service: Gynecology;  Laterality: N/A;   OTHER SURGICAL HISTORY Right 10/2011   Right thumb   Review of Systems  Constitutional:  Negative for fever, malaise/fatigue and weight loss.  Gastrointestinal:  Negative for blood in stool, constipation, diarrhea, nausea and vomiting.  Genitourinary:  Negative for dysuria, flank pain, frequency, hematuria and urgency.  Musculoskeletal:  Negative for back pain.  Skin:  Negative for itching and rash.     BP 100/70   Ht 5\' 1"  (1.549 m)   Wt 124 lb (56.2 kg)   BMI 23.43 kg/m   Physical Exam Constitutional:      General: She is not in acute distress. Genitourinary:     Vulva normal.     Right Labia: No rash, tenderness or lesions.    Left Labia: No tenderness, lesions or rash.    No vaginal discharge, erythema, tenderness  or bleeding.      Right Adnexa: not tender and no mass present.    Left Adnexa: not tender and no mass present.    No cervical motion tenderness or friability.     IUD strings visualized.     Uterus is not enlarged or tender.  Pulmonary:     Effort: Pulmonary effort is normal.  Musculoskeletal:        General: Normal range of motion.  Neurological:     General: No focal deficit present.     Mental Status: She is alert.     Cranial Nerves: No cranial nerve deficit.  Skin:    General: Skin is warm and dry.  Psychiatric:        Mood and Affect: Mood normal.        Behavior: Behavior normal.        Thought Content: Thought content normal.        Judgment: Judgment normal.  Vitals and nursing note reviewed.     IUD Removal Strings of IUD identified and grasped.  IUD removed without problem with ring forceps.  Pt tolerated this well.  IUD noted to be intact.  Assessment:  Encounter for initial prescription of injectable contraceptive - Plan: medroxyPROGESTERone Acetate 150 MG/ML SUSY; Rx depo eRxd. Pt to RTO for injection, condoms in  meantime and for 1 wk.   Endometriosis determined by laparoscopy--on Freida Busman. Will discuss increasing dose with Dr. Georgianne Fick given persistent sx.   Pelvic pain in female  Encounter for IUD removal  Meds ordered this encounter  Medications   medroxyPROGESTERone Acetate 150 MG/ML SUSY    Sig: Inject 1 mL (150 mg total) into the muscle once for 1 dose.    Dispense:  1 mL    Refill:  0    Order Specific Question:   Supervising Provider    Answer:   Gae Dry [[id number]]    Return in about 1 day (around 11/04/2021) for depo appt wtih RN / 2 months annual with pain and depo f/u.    Amy Pawlicki B. Ivoree Felmlee, PA-C 11/03/2021 5:02 PM

## 2021-11-03 ENCOUNTER — Encounter: Payer: Self-pay | Admitting: Obstetrics and Gynecology

## 2021-11-03 ENCOUNTER — Ambulatory Visit: Payer: Managed Care, Other (non HMO) | Admitting: Obstetrics and Gynecology

## 2021-11-03 ENCOUNTER — Other Ambulatory Visit: Payer: Self-pay

## 2021-11-03 VITALS — BP 100/70 | Ht 61.0 in | Wt 124.0 lb

## 2021-11-03 DIAGNOSIS — R102 Pelvic and perineal pain: Secondary | ICD-10-CM

## 2021-11-03 DIAGNOSIS — N809 Endometriosis, unspecified: Secondary | ICD-10-CM

## 2021-11-03 DIAGNOSIS — Z30432 Encounter for removal of intrauterine contraceptive device: Secondary | ICD-10-CM | POA: Diagnosis not present

## 2021-11-03 DIAGNOSIS — Z30013 Encounter for initial prescription of injectable contraceptive: Secondary | ICD-10-CM | POA: Diagnosis not present

## 2021-11-03 MED ORDER — MEDROXYPROGESTERONE ACETATE 150 MG/ML IM SUSY: 150.0000 mg | PREFILLED_SYRINGE | Freq: Once | INTRAMUSCULAR | 0 refills | Status: DC

## 2021-11-04 ENCOUNTER — Ambulatory Visit: Payer: Managed Care, Other (non HMO)

## 2021-11-05 ENCOUNTER — Other Ambulatory Visit: Payer: Self-pay

## 2021-11-05 ENCOUNTER — Ambulatory Visit (INDEPENDENT_AMBULATORY_CARE_PROVIDER_SITE_OTHER): Payer: Managed Care, Other (non HMO)

## 2021-11-05 DIAGNOSIS — Z3042 Encounter for surveillance of injectable contraceptive: Secondary | ICD-10-CM | POA: Diagnosis not present

## 2021-11-05 LAB — POCT URINE PREGNANCY: Preg Test, Ur: NEGATIVE

## 2021-11-05 MED ORDER — MEDROXYPROGESTERONE ACETATE 150 MG/ML IM SUSP
150.0000 mg | Freq: Once | INTRAMUSCULAR | Status: AC
  Administered 2021-11-05: 14:00:00 150 mg via INTRAMUSCULAR

## 2021-11-05 NOTE — Progress Notes (Signed)
Pt here for 1st depo inj; which was given IM right glut after neg UPT.  Pt tolerated well.  NDC# [phone]

## 2021-11-30 ENCOUNTER — Ambulatory Visit (INDEPENDENT_AMBULATORY_CARE_PROVIDER_SITE_OTHER): Payer: Managed Care, Other (non HMO) | Admitting: Allergy and Immunology

## 2021-11-30 ENCOUNTER — Other Ambulatory Visit: Payer: Self-pay

## 2021-11-30 ENCOUNTER — Encounter: Payer: Self-pay | Admitting: Allergy and Immunology

## 2021-11-30 VITALS — BP 98/60 | HR 88 | Resp 18

## 2021-11-30 DIAGNOSIS — J4540 Moderate persistent asthma, uncomplicated: Secondary | ICD-10-CM

## 2021-11-30 DIAGNOSIS — K219 Gastro-esophageal reflux disease without esophagitis: Secondary | ICD-10-CM

## 2021-11-30 DIAGNOSIS — J301 Allergic rhinitis due to pollen: Secondary | ICD-10-CM

## 2021-11-30 DIAGNOSIS — R430 Anosmia: Secondary | ICD-10-CM

## 2021-11-30 DIAGNOSIS — J3089 Other allergic rhinitis: Secondary | ICD-10-CM | POA: Diagnosis not present

## 2021-11-30 DIAGNOSIS — T7819XA Other adverse food reactions, not elsewhere classified, initial encounter: Secondary | ICD-10-CM

## 2021-11-30 DIAGNOSIS — T781XXA Other adverse food reactions, not elsewhere classified, initial encounter: Secondary | ICD-10-CM

## 2021-11-30 MED ORDER — AIRDUO DIGIHALER 113-14 MCG/ACT IN AEPB: INHALATION_SPRAY | RESPIRATORY_TRACT | 5 refills | Status: DC

## 2021-11-30 NOTE — Patient Instructions (Addendum)
  1.  Allergen avoidance measures -dust mite, cat, dog, other mammals, pollens, foods that give rise to oral cavity reaction  2.  Continue to treat and prevent inflammation:  A. START Air Duo 113 - 1 inhalation 2 times per day (replaces Armonair) B. OTC Nasacort - 1 spray each nostril 3-7 times per week C. Montelukast 10 mg - 1 tablet 1 time per day  3.  Continue to treat and prevent reflux/LPR/chest pain:  A. Minimize all caffeine consumption B. Omeprazole 40 mg - 1 tablet 1 time per day  4.  If needed:  A. Albuterol HFA - 2 inhalations every 4-6 hours B. Cetirizine 10 mg - 1 tablet 1-2 times per day C. Pataday - 1 drop each eye 1 time per day D. Auvi-Q 0.3, benadryl, MD/ER evaluation for allergic reaction  5.  Consider a course of immunotherapy  6.  Return to clinic in 12 weeks or earlier if problem

## 2021-11-30 NOTE — Progress Notes (Signed)
[place] [facility] [place] [facility] [place]   Follow-up Note  Referring Provider: Wayland Denis, PA-C Primary Provider: Wayland Denis, PA-C Date of Office Visit: 11/30/2021  Subjective:   Amy [name] (DOB: [birth year]) is a 20 y.o. female who returns to the [facility] on 11/30/2021 in re-evaluation of the following:  HPI: Amy [name] returns to this clinic in reevaluation of asthma, allergic rhinitis, oral allergy syndrome, anosmia, and LPR.  Her last visit to this clinic was 02 September 2021.  She does not think that her asthma is under good control.  The issue for her is that she still gets short of breath when she exerts herself and occasionally has some wheezing.  This occurs even though she has been using her inhaled steroid on a pretty consistent basis.  Fortunately, she has not required any systemic steroids to control her asthma.  Her nose is doing well although she never did regain her sense of smell.  Even after 20 days of Augmentin that was administered following the results of her CT scan she never regained her ability to smell.  She can taste okay.  She continues on Nasacort and montelukast.  Her reflux is under very good control at this point in time on omeprazole.  She still occasionally eats the foods that make her mouth itch such as apples and bananas and pineapples and pistachio.  So far she has not developed an associated systemic reaction.  Allergies as of 11/30/2021       Reactions   Fruit & Vegetable Daily [nutritional Supplements] Itching   All fruit   Other    Seasonal Allergies        Medication List    albuterol 108 (90 Base) MCG/ACT inhaler Commonly known as: VENTOLIN HFA Inhale 2 puffs into the lungs every 4 (four) hours as needed for wheezing or shortness of breath.   ArmonAir Digihaler 232 MCG/ACT Aepb Generic drug: Fluticasone Propionate(sensor) 1 inhalation 1 time per day   Calcium/D3 Adult Gummies  200-96.6-200 MG-MG-UNIT Chew Generic drug: Calcium-Phosphorus-Vitamin D Chew 1 tablet by mouth daily.   cetirizine 10 MG tablet Commonly known as: ZYRTEC 1 tablet 1-2 times per day.   EPINEPHrine 0.3 mg/0.3 mL Soaj injection Commonly known as: Auvi-Q Inject 0.3 mg into the muscle as needed for anaphylaxis. As directed for life-threatening allergic reactions   medroxyPROGESTERone Acetate 150 MG/ML Susy Inject 1 mL (150 mg total) into the muscle once for 1 dose.   montelukast 10 MG tablet Commonly known as: SINGULAIR Take 10 mg by mouth at bedtime.   omeprazole 40 MG capsule Commonly known as: PRILOSEC Take 1 capsule (40 mg total) by mouth daily.   Orilissa 150 MG Tabs Generic drug: Elagolix Sodium Take 1 tablet by mouth daily.   sodium fluoride 1.1 % Gel dental gel Commonly known as: FLUORISHIELD Take by mouth daily.    Past Medical History:  Diagnosis Date   Asthma    Broken arm    Headache    Vaccine for human papilloma virus (HPV) types 6, 11, 16, and 18 administered     Past Surgical History:  Procedure Laterality Date   LAPAROSCOPY N/A 07/22/2020   Procedure: LAPAROSCOPY DIAGNOSTIC;  Surgeon: Malachy Mood, MD;  Location: [facility] ORS;  Service: Gynecology;  Laterality: N/A;   OTHER SURGICAL HISTORY Right 10/2011   Right thumb    Review of systems negative except as noted in HPI / PMHx or noted below:  Review of Systems  Constitutional: Negative.   HENT: Negative.    Eyes: Negative.   Respiratory: Negative.    Cardiovascular: Negative.   Gastrointestinal: Negative.   Genitourinary: Negative.   Musculoskeletal: Negative.   Skin: Negative.   Neurological: Negative.   Endo/Heme/Allergies: Negative.   Psychiatric/Behavioral: Negative.      Objective:   Vitals:   11/30/21 1711  BP: 98/60  Pulse: 88  Resp: 18  SpO2: 97%          Physical Exam Constitutional:      Appearance: She is not diaphoretic.  HENT:     Head: Normocephalic.     Right  Ear: Tympanic membrane, ear canal and external ear normal.     Left Ear: Tympanic membrane, ear canal and external ear normal.     Nose: Nose normal. No mucosal edema or rhinorrhea.     Mouth/Throat:     Pharynx: Uvula midline. No oropharyngeal exudate.  Eyes:     Conjunctiva/sclera: Conjunctivae normal.  Neck:     Thyroid: No thyromegaly.     Trachea: Trachea normal. No tracheal tenderness or tracheal deviation.  Cardiovascular:     Rate and Rhythm: Normal rate and regular rhythm.     Heart sounds: Normal heart sounds, S1 normal and S2 normal. No murmur heard. Pulmonary:     Effort: No respiratory distress.     Breath sounds: Normal breath sounds. No stridor. No wheezing or rales.  Lymphadenopathy:     Head:     Right side of head: No tonsillar adenopathy.     Left side of head: No tonsillar adenopathy.     Cervical: No cervical adenopathy.  Skin:    Findings: No erythema or rash.     Nails: There is no clubbing.  Neurological:     Mental Status: She is alert.    Diagnostics:    Spirometry was performed and demonstrated an FEV1 of 2.13 at 73 % of predicted.   Results of a sinus CT scan obtained 02 September 2021 identifies the following:  Hypoplastic frontal sinuses.  Trace scattered mucosal thickening versus fluid within the bilateral ethmoid air cells.  Mucosal thickening within the bilateral maxillary sinuses (mild on the right, trace on the left).  The paranasal sinuses are otherwise normally aerated. Patent sinus drainage pathways.  Leftward deviation of the bony nasal septum.  Assessment and Plan:   1. Not well controlled moderate persistent asthma   2. Perennial allergic rhinitis   3. Seasonal allergic rhinitis due to pollen   4. Pollen-food allergy, initial encounter   5. Anosmia   6. LPRD (laryngopharyngeal reflux disease)     1.  Allergen avoidance measures -dust mite, cat, dog, other mammals, pollens, foods that give rise to oral cavity reaction  2.   Continue to treat and prevent inflammation:  A. START Air Duo 113 - 1 inhalation 2 times per day (replaces Armonair) B. OTC Nasacort - 1 spray each nostril 3-7 times per week C. Montelukast 10 mg - 1 tablet 1 time per day  3.  Continue to treat and prevent reflux/LPR/chest pain:  A. Minimize all caffeine consumption B. Omeprazole 40 mg - 1 tablet 1 time per day  4.  If needed:  A. Albuterol HFA - 2 inhalations every 4-6 hours B. Cetirizine 10 mg - 1 tablet 1-2 times per day C. Pataday - 1 drop each eye 1 time per day D. Auvi-Q 0.3, benadryl, MD/ER evaluation for allergic reaction  5.  Consider a course of immunotherapy  6.  Return to clinic in 12 weeks or earlier if problem  Ciani still appears to have some active asthma and we will start her on a combination inhaler as noted above.  It sounds as though her upper airway is doing well and she can continue on Nasacort and montelukast.  And her reflux appears to be under very good control at this point on her omeprazole.  She would definitely be a candidate for immunotherapy if she fails medical treatment.  The true test of the effectiveness of this treatment plan will be determined as she goes through this upcoming springtime season.  I will see her back in this clinic in 12 weeks or earlier if there is a problem.  Allena Katz, MD Allergy / Immunology [place]

## 2021-12-01 ENCOUNTER — Encounter: Payer: Self-pay | Admitting: Allergy and Immunology

## 2022-01-01 ENCOUNTER — Ambulatory Visit
Admission: EM | Admit: 2022-01-01 | Discharge: 2022-01-01 | Disposition: A | Discharge status: Home / Self Care | Payer: Managed Care, Other (non HMO)

## 2022-01-01 ENCOUNTER — Other Ambulatory Visit: Payer: Self-pay

## 2022-01-01 DIAGNOSIS — H9203 Otalgia, bilateral: Secondary | ICD-10-CM | POA: Diagnosis not present

## 2022-01-01 DIAGNOSIS — U071 COVID-19: Secondary | ICD-10-CM

## 2022-01-01 DIAGNOSIS — R519 Headache, unspecified: Secondary | ICD-10-CM

## 2022-01-01 DIAGNOSIS — R5383 Other fatigue: Secondary | ICD-10-CM

## 2022-01-01 DIAGNOSIS — G44209 Tension-type headache, unspecified, not intractable: Secondary | ICD-10-CM | POA: Diagnosis not present

## 2022-01-01 DIAGNOSIS — H9312 Tinnitus, left ear: Secondary | ICD-10-CM | POA: Diagnosis not present

## 2022-01-01 DIAGNOSIS — U099 Post covid-19 condition, unspecified: Secondary | ICD-10-CM | POA: Diagnosis not present

## 2022-01-01 DIAGNOSIS — R000 Tachycardia, unspecified: Secondary | ICD-10-CM

## 2022-01-01 DIAGNOSIS — R509 Fever, unspecified: Secondary | ICD-10-CM

## 2022-01-01 DIAGNOSIS — G9331 Postviral fatigue syndrome: Secondary | ICD-10-CM

## 2022-01-01 MED ORDER — KETOROLAC TROMETHAMINE 60 MG/2ML IM SOLN
60.0000 mg | Freq: Once | INTRAMUSCULAR | Status: AC
  Administered 2022-01-01: 60 mg via INTRAMUSCULAR

## 2022-01-01 MED ORDER — SUMATRIPTAN SUCCINATE 6 MG/0.5ML ~~LOC~~ SOLN
6.0000 mg | Freq: Once | SUBCUTANEOUS | Status: AC
  Administered 2022-01-01: 6 mg via SUBCUTANEOUS

## 2022-01-01 MED ORDER — IBUPROFEN 600 MG PO TABS: 600.0000 mg | ORAL_TABLET | Freq: Three times a day (TID) | ORAL | 0 refills | Status: DC | PRN

## 2022-01-01 NOTE — ED Triage Notes (Addendum)
Patient presents to Urgent Care with complaints of  bilateral ear pain dec 27th and intermittent sharp head pain since dec 30th States she was treated with prednisone jan 2nd for the ear pain. Had a telehealth visit. Instructed to have face-face visit if no improvement. Tested positive for covid.   Denies fever or drainage.

## 2022-01-01 NOTE — ED Provider Notes (Signed)
[facility]    CSN: [id number] Arrival date & time: 01/01/22  1019    HISTORY   Chief Complaint  Patient presents with   Otalgia   Headache   HPI Amy [name] is a 21 y.o. female. Patient reports positive COVID test on December 15, 2021.  Patient states overall she is feeling better however has continued to have intermittent sharp temporal headaches on both sides along with mild dull headaches on both sides that are more constant.  Patient states the pain radiates to behind her ears and her inner ear, denies drainage from ear, hearing loss.  Patient states she also has had tinnitus in her left ear, states that the sharp temporal headaches and ear pain is usually worse on the left but does occur in both ears.  Patient has been on 2 courses of prednisone since COVID began, these were prescribed via telehealth visits.  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  On arrival today, patient is tachycardic with a slightly elevated temperature.  She denies known fever but endorses feeling hot and sometimes feeling sweaty at rest.  Patient states she has been using albuterol regularly, feels that this is the reason her heart rate is elevated at this time.  Oxygenation is good.  Patient appears to be mentating well.  The history is provided by the patient.  Past Medical History:  Diagnosis Date   Asthma    Broken arm    Headache    Vaccine for human papilloma virus (HPV) types 6, 11, 16, and 18 administered    Patient Active Problem List   Diagnosis Date Noted   Endometriosis determined by laparoscopy 09/04/2020   Chronic pelvic pain in female    Family history of breast cancer 06/26/2020   Dyspareunia in female 06/26/2020   LLQ pain 12/27/2019   Episodic tension-type headache, not intractable 02/06/2015   Tension type headache 05/14/2014    Anxiety state, unspecified 05/14/2014   Past Surgical History:  Procedure Laterality Date   LAPAROSCOPY N/A 07/22/2020   Procedure: LAPAROSCOPY DIAGNOSTIC;  Surgeon: Malachy Mood, MD;  Location: [facility] OR[facility];  Service: Gynecology;  Laterality: N/A;   OTHER SURGICAL HISTORY Right 10/2011   Right thumb   OB History     Gravida  0   Para  0   Term  0   Preterm  0   AB  0   Living  0      SAB  0   IAB  0   Ectopic  0   Multiple  0   Live Births  0          Home Medications    Prior to Admission medications   Medication Sig Start Date End Date Taking? Authorizing Provider  Rosilyn Mings 113-14 MCG/ACT AEPB Inhale one dose twice daily to prevent cough or wheeze.  Rinse, gargle, and spit after use. 11/30/21   Kozlow, Donnamarie Poag, MD  albuterol (VENTOLIN HFA) 108 (90 Base) MCG/ACT inhaler Inhale 2 puffs into the lungs every 4 (four) hours as needed for wheezing or shortness of breath.  01/03/19 07/18/20  [provider]  Calcium-Phosphorus-Vitamin D (CALCIUM/D3 ADULT GUMMIES) 200-96.6-200 MG-MG-UNIT CHEW Chew 1 tablet by mouth daily.     [provider]  cetirizine (ZYRTEC) 10 MG tablet 1 tablet 1-2 times per  day. 07/23/21   Jiles Prows, MD  Elagolix Sodium (ORILISSA) 150 MG TABS Take 1 tablet by mouth daily. 09/14/21   Malachy Mood, MD  EPINEPHrine (AUVI-Q) 0.3 mg/0.3 mL IJ SOAJ injection Inject 0.3 mg into the muscle as needed for anaphylaxis. As directed for life-threatening allergic reactions 07/23/21   Kozlow, Donnamarie Poag, MD  medroxyPROGESTERone Acetate 150 MG/ML SUSY Inject 1 mL (150 mg total) into the muscle once for 1 dose. 11/03/21 38/75/64  Copland, Deirdre Evener, PA-C  montelukast (SINGULAIR) 10 MG tablet Take 10 mg by mouth at bedtime.    [provider]  omeprazole (PRILOSEC) 40 MG capsule Take 1 capsule (40 mg total) by mouth daily. 07/23/21   Kozlow, Donnamarie Poag, MD  sodium fluoride (FLUORISHIELD) 1.1 % GEL dental gel Take by mouth daily. 06/04/21    [provider]   Family History Family History  Problem Relation Age of Onset   Asthma Mother    Migraines Maternal Grandmother    Depression Maternal Grandmother    Anxiety disorder Maternal Grandmother    Diabetes Maternal Grandmother    Hypertension Maternal Grandmother    Breast cancer Paternal Grandmother        23s   Migraines Maternal Uncle        Had migraines as a teen   ADD / ADHD Maternal Uncle    Breast cancer Other 88   Stomach cancer Other    Colon cancer Other    Social History Social History   Tobacco Use   Smoking status: Never   Smokeless tobacco: Never  Vaping Use   Vaping Use: Never used  Substance Use Topics   Alcohol use: Yes    Comment: occasionally   Drug use: No   Allergies   Fruit & vegetable daily [nutritional supplements] and Other  Review of Systems Review of Systems Pertinent findings noted in history of present illness.   Physical Exam Triage Vital Signs ED Triage Vitals  Enc Vitals Group     BP 10/16/21 0827 (!) 147/82     Pulse Rate 10/16/21 0827 72     Resp 10/16/21 0827 18     Temp 10/16/21 0827 98.3 F (36.8 C)     Temp Source 10/16/21 0827 Oral     SpO2 10/16/21 0827 98 %     Weight --      Height --      Head Circumference --      Peak Flow --      Pain Score 10/16/21 0826 5     Pain Loc --      Pain Edu? --      Excl. in [place]? --   No data found.  Updated Vital Signs BP 125/82 (BP Location: Left Arm)    Pulse (!) 111    Temp 99.5 F (37.5 C) (Oral)    Resp 18    SpO2 98%   Physical Exam Vitals and nursing note reviewed.  Constitutional:      General: She is not in acute distress.    Appearance: Normal appearance. She is not ill-appearing.  HENT:     Head: Normocephalic and atraumatic. No raccoon eyes or Battle's sign.     Jaw: There is normal jaw occlusion. No tenderness or pain on movement.     Salivary Glands: Right salivary gland is not diffusely enlarged or tender. Left salivary gland is not  diffusely enlarged or tender.     Right Ear: Hearing, tympanic membrane, ear canal  and external ear normal. No drainage. No middle ear effusion. There is no impacted cerumen. Tympanic membrane is not erythematous or bulging.     Left Ear: Hearing, tympanic membrane, ear canal and external ear normal. No drainage.  No middle ear effusion. There is no impacted cerumen. Tympanic membrane is not erythematous or bulging.     Nose: Nose normal. No nasal deformity, septal deviation, mucosal edema, congestion or rhinorrhea.     Right Turbinates: Not enlarged, swollen or pale.     Left Turbinates: Not enlarged, swollen or pale.     Right Sinus: No maxillary sinus tenderness or frontal sinus tenderness.     Left Sinus: No maxillary sinus tenderness or frontal sinus tenderness.     Mouth/Throat:     Lips: Pink. No lesions.     Mouth: Mucous membranes are moist. No oral lesions.     Pharynx: Oropharynx is clear. Uvula midline. Posterior oropharyngeal erythema present. No uvula swelling.     Tonsils: No tonsillar exudate. 0 on the right. 0 on the left.  Eyes:     General: Lids are normal. No visual field deficit.       Right eye: No discharge.        Left eye: No discharge.     Extraocular Movements: Extraocular movements intact.     Right eye: Normal extraocular motion and no nystagmus.     Left eye: Normal extraocular motion and no nystagmus.     Conjunctiva/sclera: Conjunctivae normal.     Right eye: Right conjunctiva is not injected. No exudate.    Left eye: Left conjunctiva is not injected. No exudate.    Pupils: Pupils are equal, round, and reactive to light.     Slit lamp exam:    Right eye: No photophobia.     Left eye: No photophobia.  Neck:     Vascular: Normal carotid pulses. No carotid bruit.     Trachea: Trachea and phonation normal. No tracheal tenderness.  Cardiovascular:     Rate and Rhythm: Normal rate and regular rhythm.     Pulses: Normal pulses. No decreased pulses.           Carotid pulses are 2+ on the right side and 2+ on the left side.    Heart sounds: Normal heart sounds. No murmur heard.   No friction rub. No gallop.  Pulmonary:     Effort: Pulmonary effort is normal. No tachypnea, bradypnea, accessory muscle usage, prolonged expiration or respiratory distress.     Breath sounds: Normal breath sounds and air entry. No stridor, decreased air movement or transmitted upper airway sounds. No decreased breath sounds, wheezing, rhonchi or rales.  Chest:     Chest wall: No tenderness.  Musculoskeletal:        General: Normal range of motion.     Cervical back: Full passive range of motion without pain, normal range of motion and neck supple. No edema or crepitus. Muscular tenderness (Bilateral SCM's) present. No pain with movement or spinous process tenderness. Normal range of motion.  Lymphadenopathy:     Cervical: No cervical adenopathy.     Right cervical: No superficial, deep or posterior cervical adenopathy.    Left cervical: No superficial, deep or posterior cervical adenopathy.  Skin:    General: Skin is warm and dry.     Findings: No erythema or rash.  Neurological:     General: No focal deficit present.     Mental Status: She is alert and oriented  to person, place, and time.     Cranial Nerves: Cranial nerves 2-12 are intact. No cranial nerve deficit or facial asymmetry.     Sensory: Sensation is intact. No sensory deficit.     Motor: Motor function is intact. No weakness, tremor, atrophy, abnormal muscle tone, seizure activity or pronator drift.     Coordination: Coordination is intact. Coordination normal.     Gait: Gait is intact.     Deep Tendon Reflexes: Reflexes are normal and symmetric.  Psychiatric:        Mood and Affect: Mood normal.        Behavior: Behavior normal.    Visual Acuity Right Eye Distance:   Left Eye Distance:   Bilateral Distance:    Right Eye Near:   Left Eye Near:    Bilateral Near:     [facility]ouse / Diagnostics /  Procedures:    EKG  Radiology No results found.  Procedures Procedures (including critical care time)  UC Diagnoses / Final Clinical Impressions(s)   I have reviewed the triage vital signs and the nursing notes.  Pertinent labs & imaging results that were available during my care of the patient were reviewed by me and considered in my medical decision making (see chart for details).   Final diagnoses:  Post viral syndrome  COVID-19  Nonintractable headache, unspecified chronicity pattern, unspecified headache type  Temporal headache  Otalgia, bilateral  Tension-type headache, not intractable, unspecified chronicity pattern  Elevated temperature  Tachycardia  Tinnitus of left ear   Suspect migraine versus post COVID headache, patient was treated with ketorolac and Imitrex injections, patient advised to Benadryl when she gets home get some sleep.  Note provided for work.  Tested patient for flu to be complete given fever, redness in her throat and bilateral ear pain.  Patient advised that she will be provided with results once received.  ED Prescriptions     Medication Sig Dispense Auth. Provider   ibuprofen (ADVIL) 600 MG tablet  (Status: Discontinued) Take 1 tablet (600 mg total) by mouth every 8 (eight) hours as needed for up to 30 doses for fever, headache, mild pain or moderate pain (Inflammation). Take 1 tablet 3 times daily as needed for inflammation of upper airways and/or pain. 30 tablet Lynden Oxford Scales, PA-C   ibuprofen (ADVIL) 600 MG tablet Take 1 tablet (600 mg total) by mouth every 8 (eight) hours as needed for up to 30 doses for fever, headache, mild pain or moderate pain (Inflammation). Take 1 tablet 3 times daily as needed for inflammation of upper airways and/or pain. 30 tablet Lynden Oxford Scales, PA-C      PDMP not reviewed this encounter.  Pending results:  Labs Reviewed  COVID-19, FLU A+B NAA    Medications Ordered in UC: Medications   SUMAtriptan (IMITREX) injection 6 mg (6 mg Subcutaneous Given 01/01/22 1122)  ketorolac (TORADOL) injection 60 mg (60 mg Intramuscular Given 01/01/22 1125)    Disposition Upon Discharge:  Condition: stable for discharge home Home: take medications as prescribed; routine discharge instructions as discussed; follow up as advised.  Patient presented with an acute illness with associated systemic symptoms and significant discomfort requiring urgent management. In my opinion, this is a condition that a prudent lay person (someone who possesses an average knowledge of health and medicine) may potentially expect to result in complications if not addressed urgently such as respiratory distress, impairment of bodily function or dysfunction of bodily organs.   Routine symptom specific, illness  specific and/or disease specific instructions were discussed with the patient and/or caregiver at length.   As such, the patient has been evaluated and assessed, work-up was performed and treatment was provided in alignment with urgent care protocols and evidence based medicine.  Patient/parent/caregiver has been advised that the patient may require follow up for further testing and treatment if the symptoms continue in spite of treatment, as clinically indicated and appropriate.  If the patient was tested for COVID-19, Influenza and/or RSV, then the patient/parent/guardian was advised to isolate at home pending the results of his/her diagnostic coronavirus test and potentially longer if theyre positive. I have also advised pt that if his/her COVID-19 test returns positive, it's recommended to self-isolate for at least 10 days after symptoms first appeared AND until fever-free for 24 hours without fever reducer AND other symptoms have improved or resolved. Discussed self-isolation recommendations as well as instructions for household member/close contacts as per the [facility] and [place] DHHS, and also gave patient the [facility] packet  with this information.  Patient/parent/caregiver has been advised to return to the [facility] or PCP in 3-5 days if no better; to PCP or the Emergency Department if new signs and symptoms develop, or if the current signs or symptoms continue to change or worsen for further workup, evaluation and treatment as clinically indicated and appropriate  The patient will follow up with their current PCP if and as advised. If the patient does not currently have a PCP we will assist them in obtaining one.   The patient may need specialty follow up if the symptoms continue, in spite of conservative treatment and management, for further workup, evaluation, consultation and treatment as clinically indicated and appropriate.  Patient/parent/caregiver verbalized understanding and agreement of plan as discussed.  All questions were addressed during visit.  Please see discharge instructions below for further details of plan.  Discharge Instructions:   Discharge Instructions      COVIDAfter doing some research about post viral syndrome, persistent headaches are reported by 68% of of patients with outpatient COVID infections.  Because your symptoms are similar to migraine, I recommend that we initiate migraine treatment.  You received 2 injections today, ketorolac and sumatriptan.  I recommend that you go home at this time, take some Benadryl and sleep.  If you wake with a headache I think we can safely assume that this is post viral.  If you wake without a headache, then this truly was migraine and intervention is complete.  Because you have a mildly elevated temperature on arrival today, some mild erythema in the back of your throat, have been having intermittent headache and some dizziness, I felt it important to test you for influenza as well.  Regrettably, we do not have any rapid flu test at this time due to a national shortage so a send out test was performed.  Also regrettably, I am unable to order the influenza test  by itself so you received a repeat COVID test.  We will ignore the results of the COVID test because you already know that you have been diagnosed with COVID in the past 3 months.  I do not feel that is appropriate for you to return to work this evening.  I provided you with a note for your manager.  I recommend that you remain out of work until the results of your flu test are complete, typically this weeks 2 days, the result will be posted to your MyChart account and if positive you will be  contacted by phone.  In the meantime, conservative care is recommended.  Conservative care includes rest, pushing clear fluids and activity as tolerated.  Warm beverages such as teas and broths versus cold beverages/popsicles and frozen sherbet/sorbet are your choice, both warm and cold are beneficial.  You may also notice that your appetite is reduced; this is okay as long as you are drinking plenty of clear fluids.    Based on my physical exam findings and the history you have provided  today, I do not recommend antibiotics at this time.  I do not believe the risks and side effects of antibiotics would outweigh any minimal benefit that they might provide.       Please see the list below for recommended medications, dosages and frequencies to provide relief of your current symptoms:     Ibuprofen  (Advil, Motrin): This is a good anti-inflammatory medication which addresses aches, pains and inflammation of the upper airways that causes sinus and nasal congestion as well as in the lower airways which makes your cough feel tight and sometimes burn.  I recommend that you take between 400 to 600 mg every 6-8 hours as needed, I have provided you with a prescription for 600 mg.      Please remain home from work, school, public places until you have been fever free for 24 hours without the use of antifever medications such as Tylenol or ibuprofen.    Please follow-up within the next 3 to 5 days either with your primary  care provider or urgent care if your symptoms do not resolve.  If you do not have a primary care provider, we will assist you in finding one.       This office note has been dictated using Museum/gallery curator.  Unfortunately, and despite my best efforts, this method of dictation can sometimes lead to occasional typographical or grammatical errors.  I apologize in advance if this occurs.     Lynden Oxford Scales, PA-C 01/01/22 1146

## 2022-01-01 NOTE — Discharge Instructions (Signed)
COVIDAfter doing some research about post viral syndrome, persistent headaches are reported by 68% of of patients with outpatient COVID infections.  Because your symptoms are similar to migraine, I recommend that we initiate migraine treatment.  You received 2 injections today, ketorolac and sumatriptan.  I recommend that you go home at this time, take some Benadryl and sleep.  If you wake with a headache I think we can safely assume that this is post viral.  If you wake without a headache, then this truly was migraine and intervention is complete.  Because you have a mildly elevated temperature on arrival today, some mild erythema in the back of your throat, have been having intermittent headache and some dizziness, I felt it important to test you for influenza as well.  Regrettably, we do not have any rapid flu test at this time due to a national shortage so a send out test was performed.  Also regrettably, I am unable to order the influenza test by itself so you received a repeat COVID test.  We will ignore the results of the COVID test because you already know that you have been diagnosed with COVID in the past 3 months.  I do not feel that is appropriate for you to return to work this evening.  I provided you with a note for your manager.  I recommend that you remain out of work until the results of your flu test are complete, typically this weeks 2 days, the result will be posted to your MyChart account and if positive you will be contacted by phone.  In the meantime, conservative care is recommended.  Conservative care includes rest, pushing clear fluids and activity as tolerated.  Warm beverages such as teas and broths versus cold beverages/popsicles and frozen sherbet/sorbet are your choice, both warm and cold are beneficial.  You may also notice that your appetite is reduced; this is okay as long as you are drinking plenty of clear fluids.    Based on my physical exam findings and the history you  have provided  today, I do not recommend antibiotics at this time.  I do not believe the risks and side effects of antibiotics would outweigh any minimal benefit that they might provide.       Please see the list below for recommended medications, dosages and frequencies to provide relief of your current symptoms:     Ibuprofen  (Advil, Motrin): This is a good anti-inflammatory medication which addresses aches, pains and inflammation of the upper airways that causes sinus and nasal congestion as well as in the lower airways which makes your cough feel tight and sometimes burn.  I recommend that you take between 400 to 600 mg every 6-8 hours as needed, I have provided you with a prescription for 600 mg.      Please remain home from work, school, public places until you have been fever free for 24 hours without the use of antifever medications such as Tylenol or ibuprofen.    Please follow-up within the next 3 to 5 days either with your primary care provider or urgent care if your symptoms do not resolve.  If you do not have a primary care provider, we will assist you in finding one.

## 2022-01-03 LAB — COVID-19, FLU A+B NAA
Influenza A, NAA: NOT DETECTED
Influenza B, NAA: NOT DETECTED
SARS-CoV-2, NAA: NOT DETECTED

## 2022-01-28 ENCOUNTER — Other Ambulatory Visit: Payer: Self-pay

## 2022-01-28 ENCOUNTER — Ambulatory Visit (INDEPENDENT_AMBULATORY_CARE_PROVIDER_SITE_OTHER): Payer: Managed Care, Other (non HMO)

## 2022-01-28 DIAGNOSIS — Z3042 Encounter for surveillance of injectable contraceptive: Secondary | ICD-10-CM

## 2022-01-28 MED ORDER — MEDROXYPROGESTERONE ACETATE 150 MG/ML IM SUSP
150.0000 mg | Freq: Once | INTRAMUSCULAR | Status: AC
  Administered 2022-01-28: 150 mg via INTRAMUSCULAR

## 2022-01-28 NOTE — Progress Notes (Signed)
Pt here for depo which was given IM left glut.  Pt tolerated well.  NDC# [phone]

## 2022-02-08 ENCOUNTER — Ambulatory Visit
Admission: EM | Admit: 2022-02-08 | Discharge: 2022-02-08 | Disposition: A | Discharge status: Home / Self Care | Payer: Managed Care, Other (non HMO) | Attending: Internal Medicine | Admitting: Internal Medicine

## 2022-02-08 ENCOUNTER — Other Ambulatory Visit: Payer: Self-pay

## 2022-02-08 ENCOUNTER — Encounter: Payer: Self-pay | Admitting: Emergency Medicine

## 2022-02-08 DIAGNOSIS — R112 Nausea with vomiting, unspecified: Secondary | ICD-10-CM | POA: Diagnosis not present

## 2022-02-08 DIAGNOSIS — B349 Viral infection, unspecified: Secondary | ICD-10-CM

## 2022-02-08 MED ORDER — ONDANSETRON 4 MG PO TBDP
4.0000 mg | ORAL_TABLET | Freq: Three times a day (TID) | ORAL | 0 refills | Status: DC | PRN
Start: 2022-02-08 — End: 2022-08-11

## 2022-02-08 MED ORDER — BENZONATATE 100 MG PO CAPS: 100.0000 mg | ORAL_CAPSULE | Freq: Three times a day (TID) | ORAL | 0 refills | Status: DC | PRN

## 2022-02-08 NOTE — Discharge Instructions (Signed)
It appears that you have a viral illness that should self resolve in the next few days.  You have been prescribed a few medications to alleviate your symptoms.  Please increase clear oral fluid intake as well.  Follow-up if symptoms persist.

## 2022-02-08 NOTE — ED Provider Notes (Signed)
[facility]    CSN: [id number] Arrival date & time: 02/08/22  1108      History   Chief Complaint Chief Complaint  Patient presents with   Nausea   Emesis    HPI Amy [name] is a 21 y.o. female.   Patient presents with nausea with vomiting, abdominal cramping, bilateral ear pain, headache, nasal congestion, cough that has been present for approximately 3 to 4 days.  Denies chest pain, shortness of breath, fever, diarrhea, constipation.  Abdominal pain is generalized and is crampy. Denies any known sick contacts. Denies blood in emesis. Having regular BM's.    Emesis  Past Medical History:  Diagnosis Date   Asthma    Broken arm    Headache    Vaccine for human papilloma virus (HPV) types 6, 11, 16, and 18 administered     Patient Active Problem List   Diagnosis Date Noted   Endometriosis determined by laparoscopy 09/04/2020   Chronic pelvic pain in female    Family history of breast cancer 06/26/2020   Dyspareunia in female 06/26/2020   LLQ pain 12/27/2019   Episodic tension-type headache, not intractable 02/06/2015   Tension type headache 05/14/2014   Anxiety state, unspecified 05/14/2014    Past Surgical History:  Procedure Laterality Date   LAPAROSCOPY N/A 07/22/2020   Procedure: LAPAROSCOPY DIAGNOSTIC;  Surgeon: Malachy Mood, MD;  Location: [facility] ORS;  Service: Gynecology;  Laterality: N/A;   OTHER SURGICAL HISTORY Right 10/2011   Right thumb    OB History     Gravida  0   Para  0   Term  0   Preterm  0   AB  0   Living  0      SAB  0   IAB  0   Ectopic  0   Multiple  0   Live Births  0            Home Medications    Prior to Admission medications   Medication Sig Start Date End Date Taking? Authorizing Provider  benzonatate (TESSALON) 100 MG capsule Take 1 capsule (100 mg total) by mouth every 8 (eight) hours as needed for cough. 02/08/22  Yes Okechukwu Regnier, [name] E, FNP  ondansetron (ZOFRAN-ODT) 4 MG  disintegrating tablet Take 1 tablet (4 mg total) by mouth every 8 (eight) hours as needed for nausea or vomiting. 02/08/22  Yes Teodora Medici,   AIRDUO [facility] 951-88 MCG/ACT AEPB Inhale one dose twice daily to prevent cough or wheeze.  Rinse, gargle, and spit after use. 11/30/21   Kozlow, Donnamarie Poag, MD  albuterol (VENTOLIN HFA) 108 (90 Base) MCG/ACT inhaler Inhale 2 puffs into the lungs every 4 (four) hours as needed for wheezing or shortness of breath.  01/03/19 07/18/20  [provider]  Calcium-Phosphorus-Vitamin D (CALCIUM/D3 ADULT GUMMIES) 200-96.6-200 MG-MG-UNIT CHEW Chew 1 tablet by mouth daily.     [provider]  cetirizine (ZYRTEC) 10 MG tablet 1 tablet 1-2 times per day. 07/23/21   Kozlow, Donnamarie Poag, MD  Elagolix Sodium (ORILISSA) 150 MG TABS Take 1 tablet by mouth daily. 09/14/21   Malachy Mood, MD  EPINEPHrine (AUVI-Q) 0.3 mg/0.3 mL IJ SOAJ injection Inject 0.3 mg into the muscle as needed for anaphylaxis. As directed for life-threatening allergic reactions 07/23/21   Kozlow, Donnamarie Poag, MD  ibuprofen (ADVIL) 600 MG tablet Take 1 tablet (600 mg total) by mouth every 8 (eight) hours as needed for up to 30 doses for fever,  headache, mild pain or moderate pain (Inflammation). Take 1 tablet 3 times daily as needed for inflammation of upper airways and/or pain. 01/01/22   Lynden Oxford Scales, PA-C  medroxyPROGESTERone Acetate 150 MG/ML SUSY Inject 1 mL (150 mg total) into the muscle once for 1 dose. 11/03/21 24/40/10  Copland, Deirdre Evener, PA-C  montelukast (SINGULAIR) 10 MG tablet Take 10 mg by mouth at bedtime.    [provider]  omeprazole (PRILOSEC) 40 MG capsule Take 1 capsule (40 mg total) by mouth daily. 07/23/21   Kozlow, Donnamarie Poag, MD  sodium fluoride (FLUORISHIELD) 1.1 % GEL dental gel Take by mouth daily. 06/04/21   [provider]    Family History Family History  Problem Relation Age of Onset   Asthma Mother    Migraines Maternal Grandmother     Depression Maternal Grandmother    Anxiety disorder Maternal Grandmother    Diabetes Maternal Grandmother    Hypertension Maternal Grandmother    Breast cancer Paternal Grandmother        42s   Migraines Maternal Uncle        Had migraines as a teen   ADD / ADHD Maternal Uncle    Breast cancer Other 15   Stomach cancer Other    Colon cancer Other     Social History Social History   Tobacco Use   Smoking status: Never   Smokeless tobacco: Never  Vaping Use   Vaping Use: Never used  Substance Use Topics   Alcohol use: Yes    Comment: occasionally   Drug use: No     Allergies   Fruit & vegetable daily [nutritional supplements] and Other   Review of Systems Review of Systems Per HPI  Physical Exam Triage Vital Signs ED Triage Vitals [02/08/22 1229]  Enc Vitals Group     BP 125/79     Pulse Rate 79     Resp 16     Temp 98.4 F (36.9 C)     Temp Source Oral     SpO2 98 %     Weight      Height      Head Circumference      Peak Flow      Pain Score      Pain Loc      Pain Edu?      Excl. in [place]?    No data found.  Updated Vital Signs BP 125/79 (BP Location: Left Arm)    Pulse 79    Temp 98.4 F (36.9 C) (Oral)    Resp 16    SpO2 98%   Visual Acuity Right Eye Distance:   Left Eye Distance:   Bilateral Distance:    Right Eye Near:   Left Eye Near:    Bilateral Near:     Physical Exam Constitutional:      General: She is not in acute distress.    Appearance: Normal appearance. She is not toxic-appearing or diaphoretic.  HENT:     Head: Normocephalic and atraumatic.     Right Ear: Tympanic membrane and ear canal normal.     Left Ear: Tympanic membrane and ear canal normal.     Nose: Congestion present.     Mouth/Throat:     Mouth: Mucous membranes are moist.     Pharynx: No posterior oropharyngeal erythema.  Eyes:     Extraocular Movements: Extraocular movements intact.     Conjunctiva/sclera: Conjunctivae normal.     Pupils: Pupils are  equal, round, and reactive to light.  Cardiovascular:     Rate and Rhythm: Normal rate and regular rhythm.     Pulses: Normal pulses.     Heart sounds: Normal heart sounds.  Pulmonary:     Effort: Pulmonary effort is normal. No respiratory distress.     Breath sounds: Normal breath sounds. No stridor. No wheezing, rhonchi or rales.  Abdominal:     General: Abdomen is flat. Bowel sounds are normal. There is no distension.     Palpations: Abdomen is soft.     Tenderness: There is no abdominal tenderness.  Musculoskeletal:        General: Normal range of motion.     Cervical back: Normal range of motion.  Skin:    General: Skin is warm and dry.  Neurological:     General: No focal deficit present.     Mental Status: She is alert and oriented to person, place, and time. Mental status is at baseline.  Psychiatric:        Mood and Affect: Mood normal.        Behavior: Behavior normal.     UC Treatments / Results  Labs (all labs ordered are listed, but only abnormal results are displayed) Labs Reviewed  COVID-19, FLU A+B NAA    EKG   Radiology No results found.  Procedures Procedures (including critical care time)  Medications Ordered in UC Medications - No data to display  Initial Impression / Assessment and Plan / UC Course  I have reviewed the triage vital signs and the nursing notes.  Pertinent labs & imaging results that were available during my care of the patient were reviewed by me and considered in my medical decision making (see chart for details).     Patient's symptoms appear viral in etiology.  Discussed supportive care and symptomatic treatment.  Patient sent prescriptions to help alleviate symptoms including Zofran and benzonatate.  COVID test pending.  Discussed strict return precautions.  Patient to increase clear oral food intake to prevent dehydration.  No signs of dehydration at this time.  Patient verbalized understanding and was agreeable with  plan. Final Clinical Impressions(s) / UC Diagnoses   Final diagnoses:  Viral illness  Nausea and vomiting, unspecified vomiting type     Discharge Instructions      It appears that you have a viral illness that should self resolve in the next few days.  You have been prescribed a few medications to alleviate your symptoms.  Please increase clear oral fluid intake as well.  Follow-up if symptoms persist.    ED Prescriptions     Medication Sig Dispense Auth. Provider   ondansetron (ZOFRAN-ODT) 4 MG disintegrating tablet Take 1 tablet (4 mg total) by mouth every 8 (eight) hours as needed for nausea or vomiting. 20 tablet Aromas, [place] E, [place]   benzonatate (TESSALON) 100 MG capsule Take 1 capsule (100 mg total) by mouth every 8 (eight) hours as needed for cough. 21 capsule Erwin, Michele Rockers, [place]      PDMP not reviewed this encounter.   Teodora Medici, [place] 02/08/22 1316

## 2022-02-08 NOTE — ED Triage Notes (Signed)
Reports new onset nausea, vomiting, abdominal cramping, bilateral ear pain, headache, nasal congestion. Hx of endometriosis. Denies diarrhea, fever. Reports she is on Depo, but that she can't completely rule out pregnancy.

## 2022-02-09 LAB — COVID-19, FLU A+B NAA
Influenza A, NAA: NOT DETECTED
Influenza B, NAA: NOT DETECTED
SARS-CoV-2, NAA: NOT DETECTED

## 2022-02-22 ENCOUNTER — Encounter: Payer: Self-pay | Admitting: Allergy and Immunology

## 2022-02-22 ENCOUNTER — Other Ambulatory Visit: Payer: Self-pay

## 2022-02-22 ENCOUNTER — Ambulatory Visit (INDEPENDENT_AMBULATORY_CARE_PROVIDER_SITE_OTHER): Payer: Managed Care, Other (non HMO) | Admitting: Allergy and Immunology

## 2022-02-22 VITALS — BP 134/80 | HR 108 | Temp 98.3°F | Resp 20 | Ht 61.0 in | Wt 137.0 lb

## 2022-02-22 DIAGNOSIS — J3089 Other allergic rhinitis: Secondary | ICD-10-CM | POA: Diagnosis not present

## 2022-02-22 DIAGNOSIS — T781XXA Other adverse food reactions, not elsewhere classified, initial encounter: Secondary | ICD-10-CM

## 2022-02-22 DIAGNOSIS — J301 Allergic rhinitis due to pollen: Secondary | ICD-10-CM

## 2022-02-22 DIAGNOSIS — J4540 Moderate persistent asthma, uncomplicated: Secondary | ICD-10-CM

## 2022-02-22 NOTE — Patient Instructions (Signed)
?  1.  Allergen avoidance measures -dust mite, cat, dog, other mammals, pollens, foods that give rise to oral cavity reaction ? ?2.  Continue to treat and prevent inflammation: ? ?A. Air Duo 113 - 1 inhalation 1-2 times per day  ?B. OTC Nasacort - 1 spray each nostril 3-7 times per week ?C. Montelukast 10 mg - 1 tablet 1 time per day ? ?3.  Continue to treat and prevent reflux/LPR/chest pain: ? ?A. Minimize all caffeine consumption ?B. Omeprazole 40 mg - 1 tablet 1 time per day ? ?4.  If needed: ? ?A. Albuterol HFA - 2 inhalations every 4-6 hours ?B. Cetirizine 10 mg - 1 tablet 1-2 times per day ?C. Pataday - 1 drop each eye 1 time per day ?D. Auvi-Q 0.3, benadryl, MD/ER evaluation for allergic reaction ? ?5.  Consider a course of immunotherapy ? ?6.  Return to clinic in summer 2023 or earlier if problem ?

## 2022-02-22 NOTE — Progress Notes (Signed)
? ?[place] ? ? ?Follow-up Note ? ?Referring Provider: Wayland Denis, PA-C ?Primary Provider: Wayland Denis, PA-C ?Date of Office Visit: 02/22/2022 ? ?Subjective:  ? ?Amy [name] (DOB: [birth year]) is a 21 y.o. female who returns to the [facility] on 02/22/2022 in re-evaluation of the following: ? ?HPI: Nakaiya returns to this clinic in evaluation of asthma, allergic rhinitis, oral allergy syndrome, anosmia, and LPR.  Her last visit to this clinic was 30 November 2021. ? ?Overall her airway has done relatively well.  She has not required a systemic steroid or antibiotic for any type of airway issue.  She rarely uses a short acting bronchodilator and she can exert herself without any problem.  She has been consistently using her air duo twice a day and uses Nasacort about 3 times per week. ? ?Her reflux is doing well at this point in time on therapy. ? ?She still remains away from all foods that give rise to oral itching. ? ?Allergies as of 02/22/2022   ? ?   Reactions  ? Fruit & Vegetable Daily [nutritional Supplements] Itching  ? All fruit  ? Other   ? Seasonal Allergies  ? ?  ? ?  ?Medication List  ? ? ?AirDuo Digihaler 113-14 MCG/ACT Aepb ?Generic drug: Fluticasone-Salmeterol(sensor) ?Inhale one dose twice daily to prevent cough or wheeze.  Rinse, gargle, and spit after use. ?  ?albuterol 108 (90 Base) MCG/ACT inhaler ?Commonly known as: VENTOLIN HFA ?Inhale 2 puffs into the lungs every 4 (four) hours as needed for wheezing or shortness of breath. ?  ?benzonatate 100 MG capsule ?Commonly known as: TESSALON ?Take 1 capsule (100 mg total) by mouth every 8 (eight) hours as needed for cough. ?  ?Calcium/D3 Adult Gummies 200-96.6-200 MG-MG-UNIT Chew ?Generic drug: Calcium-Phosphorus-Vitamin D ?Chew 1 tablet by mouth daily. ?  ?cetirizine 10 MG tablet ?Commonly known as: ZYRTEC ?1 tablet 1-2 times per day. ?  ?EPINEPHrine 0.3 mg/0.3 mL Soaj  injection ?Commonly known as: Auvi-Q ?Inject 0.3 mg into the muscle as needed for anaphylaxis. As directed for life-threatening allergic reactions ?  ?ibuprofen 600 MG tablet ?Commonly known as: ADVIL ?Take 1 tablet (600 mg total) by mouth every 8 (eight) hours as needed for up to 30 doses for fever, headache, mild pain or moderate pain (Inflammation). Take 1 tablet 3 times daily as needed for inflammation of upper airways and/or pain. ?  ?medroxyPROGESTERone Acetate 150 MG/ML [place] ?Inject 1 mL (150 mg total) into the muscle once for 1 dose. ?  ?montelukast 10 MG tablet ?Commonly known as: SINGULAIR ?Take 10 mg by mouth at bedtime. ?  ?omeprazole 40 MG capsule ?Commonly known as: PRILOSEC ?Take 1 capsule (40 mg total) by mouth daily. ?  ?ondansetron 4 MG disintegrating tablet ?Commonly known as: ZOFRAN-ODT ?Take 1 tablet (4 mg total) by mouth every 8 (eight) hours as needed for nausea or vomiting. ?  ?Orilissa 150 MG Tabs ?Generic drug: Elagolix Sodium ?Take 1 tablet by mouth daily. ?  ?promethazine 25 MG tablet ?Commonly known as: PHENERGAN ?Take 25 mg by mouth every 6 (six) hours as needed. ?  ?sodium fluoride 1.1 % Gel dental gel ?Commonly known as: FLUORISHIELD ?Take by mouth daily. ?  ? ?Past Medical History:  ?Diagnosis Date  ? Asthma   ? Broken arm   ? Headache   ? Vaccine for human papilloma virus (HPV) types 6, 11, 16, and 18 administered   ? ? ?  Past Surgical History:  ?Procedure Laterality Date  ? LAPAROSCOPY N/A 07/22/2020  ? Procedure: LAPAROSCOPY DIAGNOSTIC;  Surgeon: Malachy Mood, MD;  Location: [facility] OR[facility];  Service: Gynecology;  Laterality: N/A;  ? OTHER SURGICAL HISTORY Right 10/2011  ? Right thumb  ? ? ?Review of systems negative except as noted in HPI / PMHx or noted below: ? ?Review of Systems  ?Constitutional: Negative.   ?HENT: Negative.    ?Eyes: Negative.   ?Respiratory: Negative.    ?Cardiovascular: Negative.   ?Gastrointestinal: Negative.   ?Genitourinary: Negative.   ?Musculoskeletal:  Negative.   ?Skin: Negative.   ?Neurological: Negative.   ?Endo/Heme/Allergies: Negative.   ?Psychiatric/Behavioral: Negative.    ? ? ?Objective:  ? ?Vitals:  ? 02/22/22 1344  ?BP: 134/80  ?Pulse: (!) 108  ?Resp: 20  ?Temp: 98.3 ?F (36.8 ?C)  ?SpO2: 98%  ? ?Height: '5\' 1"'$  (154.9 cm)  ?Weight: 137 lb (62.1 kg)  ? ?Physical Exam ?Constitutional:   ?   Appearance: She is not diaphoretic.  ?HENT:  ?   Head: Normocephalic.  ?   Right Ear: Tympanic membrane, ear canal and external ear normal.  ?   Left Ear: Tympanic membrane, ear canal and external ear normal.  ?   Nose: Nose normal. No mucosal edema or rhinorrhea.  ?   Mouth/Throat:  ?   Pharynx: Uvula midline. No oropharyngeal exudate.  ?Eyes:  ?   Conjunctiva/sclera: Conjunctivae normal.  ?Neck:  ?   Thyroid: No thyromegaly.  ?   Trachea: Trachea normal. No tracheal tenderness or tracheal deviation.  ?Cardiovascular:  ?   Rate and Rhythm: Normal rate and regular rhythm.  ?   Heart sounds: Normal heart sounds, S1 normal and S2 normal. No murmur heard. ?Pulmonary:  ?   Effort: No respiratory distress.  ?   Breath sounds: Normal breath sounds. No stridor. No wheezing or rales.  ?Lymphadenopathy:  ?   Head:  ?   Right side of head: No tonsillar adenopathy.  ?   Left side of head: No tonsillar adenopathy.  ?   Cervical: No cervical adenopathy.  ?Skin: ?   Findings: No erythema or rash.  ?   Nails: There is no clubbing.  ?Neurological:  ?   Mental Status: She is alert.  ? ? ?Diagnostics:  ?  ?Spirometry was performed and demonstrated an FEV1 of 2.26 at 78 % of predicted. ? ?Assessment and Plan:  ? ?1. Asthma, moderate persistent, well-controlled   ?2. Perennial allergic rhinitis   ?3. Seasonal allergic rhinitis due to pollen   ?4. Pollen-food allergy, initial encounter   ? ? ?1.  Allergen avoidance measures -dust mite, cat, dog, other mammals, pollens, foods that give rise to oral cavity reaction ? ?2.  Continue to treat and prevent inflammation: ? ?A. Air Duo 113 - 1  inhalation 1-2 times per day  ?B. OTC Nasacort - 1 spray each nostril 3-7 times per week ?C. Montelukast 10 mg - 1 tablet 1 time per day ? ?3.  Continue to treat and prevent reflux/LPR/chest pain: ? ?A. Minimize all caffeine consumption ?B. Omeprazole 40 mg - 1 tablet 1 time per day ? ?4.  If needed: ? ?A. Albuterol HFA - 2 inhalations every 4-6 hours ?B. Cetirizine 10 mg - 1 tablet 1-2 times per day ?C. Pataday - 1 drop each eye 1 time per day ?D. Auvi-Q 0.3, benadryl, MD/ER evaluation for allergic reaction ? ?5.  Consider a course of immunotherapy ? ?6.  Return to  clinic in summer 2023 or earlier if problem ? ?[name] appears to be doing relatively well at this point in time on her current therapy which includes a collection of anti-inflammatory agents for airway and therapy directed against reflux.  Hopefully she is going to do this well as she goes through this upcoming springtime season.  Assuming she does not have any difficulties utilizing the plan noted above I will see her back in this clinic in the summer 2023 or earlier if there is a problem. ? ?Allena Katz, MD ?Allergy / Immunology ?[place] ?

## 2022-02-23 ENCOUNTER — Encounter: Payer: Self-pay | Admitting: Allergy and Immunology

## 2022-04-08 DIAGNOSIS — M79641 Pain in right hand: Secondary | ICD-10-CM | POA: Insufficient documentation

## 2022-05-04 ENCOUNTER — Encounter: Payer: Self-pay | Admitting: Emergency Medicine

## 2022-05-04 ENCOUNTER — Other Ambulatory Visit: Payer: Self-pay

## 2022-05-04 ENCOUNTER — Ambulatory Visit
Admission: EM | Admit: 2022-05-04 | Discharge: 2022-05-04 | Disposition: A | Discharge status: Home / Self Care | Payer: Managed Care, Other (non HMO) | Attending: Emergency Medicine | Admitting: Emergency Medicine

## 2022-05-04 DIAGNOSIS — R059 Cough, unspecified: Secondary | ICD-10-CM | POA: Insufficient documentation

## 2022-05-04 DIAGNOSIS — J069 Acute upper respiratory infection, unspecified: Secondary | ICD-10-CM

## 2022-05-04 NOTE — ED Provider Notes (Signed)
?[facility] ? ? ? ?CSN: [id number] ?Arrival date & time: 05/04/22  2694 ? ? ?  ? ?History   ?Chief Complaint ?Chief Complaint  ?Patient presents with  ? Nasal Congestion  ? Cough  ? ? ?HPI ?Amy [name] is a 21 y.o. female.  ?She presents today with 3 day history of congestion and cough. Stuffy nose and pressure in ears. Cough is dry and occasional. She has extensive history of allergy and asthma for which she sees allergist. Takes zyrtec, montelukast. Has tried benadryl.  ?Denies fever, chills, shortness of breath, abdominal pain, vomiting/diarrhea, rash. ? ?Past Medical History:  ?Diagnosis Date  ? Asthma   ? Broken arm   ? Headache   ? Vaccine for human papilloma virus (HPV) types 6, 11, 16, and 18 administered   ? ? ?Patient Active Problem List  ? Diagnosis Date Noted  ? Cough 05/04/2022  ? Endometriosis determined by laparoscopy 09/04/2020  ? Chronic pelvic pain in female   ? Family history of breast cancer 06/26/2020  ? Dyspareunia in female 06/26/2020  ? LLQ pain 12/27/2019  ? Episodic tension-type headache, not intractable 02/06/2015  ? Tension type headache 05/14/2014  ? Anxiety state, unspecified 05/14/2014  ? ? ?Past Surgical History:  ?Procedure Laterality Date  ? LAPAROSCOPY N/A 07/22/2020  ? Procedure: LAPAROSCOPY DIAGNOSTIC;  Surgeon: Malachy Mood, MD;  Location: [facility] ORS;  Service: Gynecology;  Laterality: N/A;  ? OTHER SURGICAL HISTORY Right 10/2011  ? Right thumb  ? ? ?OB History   ? ? Gravida  ?0  ? Para  ?0  ? Term  ?0  ? Preterm  ?0  ? AB  ?0  ? Living  ?0  ?  ? ? SAB  ?0  ? IAB  ?0  ? Ectopic  ?0  ? Multiple  ?0  ? Live Births  ?0  ?   ?  ?  ? ? ? ?Home Medications   ? ?Prior to Admission medications   ?Medication Sig Start Date End Date Taking? Authorizing Provider  ?AIRDUO DIGIHALER 113-14 MCG/ACT AEPB Inhale one dose twice daily to prevent cough or wheeze.  Rinse, gargle, and spit after use. 11/30/21   Kozlow, Donnamarie Poag, MD  ?albuterol (VENTOLIN HFA) 108 (90 Base) MCG/ACT  inhaler Inhale 2 puffs into the lungs every 4 (four) hours as needed for wheezing or shortness of breath.  01/03/19 07/18/20  [provider]  ?benzonatate (TESSALON) 100 MG capsule Take 1 capsule (100 mg total) by mouth every 8 (eight) hours as needed for cough. 02/08/22   Teodora Medici, FNP  ?Calcium-Phosphorus-Vitamin D (CALCIUM/D3 ADULT GUMMIES) 200-96.6-200 MG-MG-UNIT CHEW Chew 1 tablet by mouth daily.     [provider]  ?cetirizine (ZYRTEC) 10 MG tablet 1 tablet 1-2 times per day. 07/23/21   Kozlow, Donnamarie Poag, MD  ?Elagolix Sodium (ORILISSA) 150 MG TABS Take 1 tablet by mouth daily. 09/14/21   Malachy Mood, MD  ?EPINEPHrine (AUVI-Q) 0.3 mg/0.3 mL IJ SOAJ injection Inject 0.3 mg into the muscle as needed for anaphylaxis. As directed for life-threatening allergic reactions 07/23/21   Kozlow, Donnamarie Poag, MD  ?ibuprofen (ADVIL) 600 MG tablet Take 1 tablet (600 mg total) by mouth every 8 (eight) hours as needed for up to 30 doses for fever, headache, mild pain or moderate pain (Inflammation). Take 1 tablet 3 times daily as needed for inflammation of upper airways and/or pain. 01/01/22   Lynden Oxford Scales, PA-C  ?medroxyPROGESTERone Acetate 150 MG/ML SUSY  Inject 1 mL (150 mg total) into the muscle once for 1 dose. 11/03/21 41/63/84  Copland, Deirdre Evener, PA-C  ?montelukast (SINGULAIR) 10 MG tablet Take 10 mg by mouth at bedtime.    [provider]  ?omeprazole (PRILOSEC) 40 MG capsule Take 1 capsule (40 mg total) by mouth daily. 07/23/21   Kozlow, Donnamarie Poag, MD  ?ondansetron (ZOFRAN-ODT) 4 MG disintegrating tablet Take 1 tablet (4 mg total) by mouth every 8 (eight) hours as needed for nausea or vomiting. 02/08/22   Teodora Medici, FNP  ?promethazine (PHENERGAN) 25 MG tablet Take 25 mg by mouth every 6 (six) hours as needed. 02/10/22   [provider]  ?sodium fluoride (FLUORISHIELD) 1.1 % GEL dental gel Take by mouth daily. 06/04/21   [provider]  ? ? ?Family History ?Family  History  ?Problem Relation Age of Onset  ? Asthma Mother   ? Migraines Maternal Grandmother   ? Depression Maternal Grandmother   ? Anxiety disorder Maternal Grandmother   ? Diabetes Maternal Grandmother   ? Hypertension Maternal Grandmother   ? Breast cancer Paternal Grandmother   ?     20s  ? Migraines Maternal Uncle   ?     Had migraines as a teen  ? ADD / ADHD Maternal Uncle   ? Breast cancer Other 22  ? Stomach cancer Other   ? Colon cancer Other   ? ? ?Social History ?Social History  ? ?Tobacco Use  ? Smoking status: Never  ? Smokeless tobacco: Never  ?Vaping Use  ? Vaping Use: Never used  ?Substance Use Topics  ? Alcohol use: Yes  ?  Comment: occasionally  ? Drug use: No  ? ? ? ?Allergies   ?Fruit & vegetable daily [nutritional supplements] and Other ? ? ?Review of Systems ?Review of Systems  ?HENT:  Positive for congestion.   ?Respiratory:  Positive for cough.   ?As per HPI ? ?Physical Exam ?Triage Vital Signs ?ED Triage Vitals [05/04/22 0838]  ?Enc Vitals Group  ?   BP 130/83  ?   Pulse Rate 83  ?   Resp 18  ?   Temp 98.7 ?F (37.1 ?C)  ?   Temp Source Oral  ?   SpO2 100 %  ?   Weight   ?   Height   ?   Head Circumference   ?   Peak Flow   ?   Pain Score 3  ?   Pain Loc   ?   Pain Edu?   ?   Excl. in [place]?   ? ?No data found. ? ?Updated Vital Signs ?BP 130/83 (BP Location: Left Arm)   Pulse 83   Temp 98.7 ?F (37.1 ?C) (Oral)   Resp 18   SpO2 100%  ? ? ? ?Physical Exam ?Vitals and nursing note reviewed.  ?Constitutional:   ?   General: She is not in acute distress. ?HENT:  ?   Right Ear: Tympanic membrane and ear canal normal.  ?   Left Ear: Tympanic membrane and ear canal normal.  ?   Nose: Congestion present. No nasal tenderness or rhinorrhea.  ?   Right Sinus: No maxillary sinus tenderness or frontal sinus tenderness.  ?   Left Sinus: No maxillary sinus tenderness or frontal sinus tenderness.  ?   Comments: Narrow nares ?   Mouth/Throat:  ?   Mouth: Mucous membranes are moist.  ?   Pharynx: Uvula  midline. No posterior oropharyngeal  erythema.  ?   Tonsils: No tonsillar exudate or tonsillar abscesses.  ?Eyes:  ?   Conjunctiva/sclera: Conjunctivae normal.  ?   Pupils: Pupils are equal, round, and reactive to light.  ?Cardiovascular:  ?   Rate and Rhythm: Normal rate and regular rhythm.  ?   Heart sounds: Normal heart sounds.  ?Pulmonary:  ?   Effort: Pulmonary effort is normal. No respiratory distress.  ?   Breath sounds: Normal breath sounds. No wheezing.  ?Abdominal:  ?   General: Bowel sounds are normal.  ?   Palpations: Abdomen is soft.  ?   Tenderness: There is no abdominal tenderness.  ?Musculoskeletal:  ?   Cervical back: Normal range of motion.  ?Lymphadenopathy:  ?   Cervical: No cervical adenopathy.  ?Neurological:  ?   Mental Status: She is alert and oriented to person, place, and time.  ? ? ? ?UC Treatments / Results  ?Labs ?(all labs ordered are listed, but only abnormal results are displayed) ?Labs Reviewed  ?NOVEL CORONAVIRUS, NAA  ? ? ?EKG ? ?Radiology ?No results found. ? ?Procedures ?Procedures (including critical care time) ? ?Medications Ordered in UC ?Medications - No data to display ? ?Initial Impression / Assessment and Plan / UC Course  ?I have reviewed the triage vital signs and the nursing notes. ? ?Pertinent labs & imaging results that were available during my care of the patient were reviewed by me and considered in my medical decision making (see chart for details). ? ?  ?At this time believe this is viral URI or aggravation of allergies. Discussed with patient to continue home medications. She does not use nasal spray as this makes her nose bleed. She cannot use humidifier as it aggravates her asthma. Recommend to try OTC decongestant and continue benadryl. Patient was worried for sinus infection but discussed short duration of symptoms and unlikely this is bacterial. We discussed return precautions and patient agrees to plan. She is discharged in stable condition. ? ?Final  Clinical Impressions(s) / UC Diagnoses  ? ?Final diagnoses:  ?Viral URI with cough  ? ? ? ?Discharge Instructions   ? ?  ?Please continue home medications and try OTC decongestant as needed.  ? ?Follow up with your allergist for

## 2022-05-04 NOTE — Discharge Instructions (Signed)
Please continue home medications and try OTC decongestant as needed.  ? ?Follow up with your allergist for further recommendations if symptoms persist. ? ?Please return to the urgent care or emergency department if symptoms worsen or do not improve. ?

## 2022-05-04 NOTE — ED Triage Notes (Signed)
Pt here for cough and nasal congestion with sinus pressure x 1 week ?

## 2022-05-05 LAB — NOVEL CORONAVIRUS, NAA: SARS-CoV-2, NAA: NOT DETECTED

## 2022-05-07 DIAGNOSIS — G5601 Carpal tunnel syndrome, right upper limb: Secondary | ICD-10-CM | POA: Insufficient documentation

## 2022-06-14 ENCOUNTER — Other Ambulatory Visit: Payer: Self-pay | Admitting: Student

## 2022-06-14 DIAGNOSIS — R112 Nausea with vomiting, unspecified: Secondary | ICD-10-CM

## 2022-06-14 DIAGNOSIS — R1011 Right upper quadrant pain: Secondary | ICD-10-CM

## 2022-06-15 ENCOUNTER — Ambulatory Visit
Admission: RE | Admit: 2022-06-15 | Discharge: 2022-06-15 | Disposition: A | Discharge status: Home / Self Care | Payer: Managed Care, Other (non HMO) | Source: Ambulatory Visit | Attending: Student | Admitting: Student

## 2022-06-15 DIAGNOSIS — R1011 Right upper quadrant pain: Secondary | ICD-10-CM | POA: Diagnosis present

## 2022-06-15 DIAGNOSIS — R112 Nausea with vomiting, unspecified: Secondary | ICD-10-CM | POA: Diagnosis present

## 2022-07-12 ENCOUNTER — Encounter: Payer: Self-pay | Admitting: Allergy and Immunology

## 2022-07-12 ENCOUNTER — Ambulatory Visit: Payer: Managed Care, Other (non HMO) | Admitting: Allergy and Immunology

## 2022-07-12 VITALS — BP 118/72 | HR 78 | Resp 16

## 2022-07-12 DIAGNOSIS — R112 Nausea with vomiting, unspecified: Secondary | ICD-10-CM

## 2022-07-12 DIAGNOSIS — T781XXA Other adverse food reactions, not elsewhere classified, initial encounter: Secondary | ICD-10-CM

## 2022-07-12 DIAGNOSIS — J3089 Other allergic rhinitis: Secondary | ICD-10-CM | POA: Diagnosis not present

## 2022-07-12 DIAGNOSIS — K219 Gastro-esophageal reflux disease without esophagitis: Secondary | ICD-10-CM

## 2022-07-12 DIAGNOSIS — J4540 Moderate persistent asthma, uncomplicated: Secondary | ICD-10-CM | POA: Diagnosis not present

## 2022-07-12 DIAGNOSIS — J301 Allergic rhinitis due to pollen: Secondary | ICD-10-CM

## 2022-07-12 NOTE — Progress Notes (Unsigned)
- [facility] [place]   Follow-up Note  Referring Provider: Wayland Denis, PA-C Primary Provider: Wayland Denis, PA-C Date of Office Visit: 07/12/2022  Subjective:   Amy [name] (DOB: February 07, 2001) is a 21 y.o. female who returns to the [facility] on 07/12/2022 in re-evaluation of the following:  HPI: [name] returns to this clinic in evaluation of asthma, allergic rhinitis, oral allergy syndrome, anosmia, and LPR.  Her last visit to this clinic was 22 February 2022.  She is really done well with her asthma while using air duo just 1 time per day and rarely uses a short acting bronchodilator averaging out to 1 time every week or so.  She does not exercise to any significant degree.  She has not required a systemic steroid to treat an exacerbation.  Her nose has been doing well while using Nasacort 1 or 2 times per week.  Sometimes she will get nosebleeds if she uses this medication on a consistent basis.  It does not sound as though she is required an antibiotic to treat an episode of sinusitis.  She might of had a flareup of her respiratory tract issue involving her nose that was secondary to a viral respiratory tract infection sometime this spring but that was relatively short-lived and resolved completely.  She does not have any chest pain and she does not have any throat problems with her reflux but she has been having recurrent vomiting and nausea.  She had a spell of this in February that went on for at least a month and maybe even a little bit longer and then she had recrudescence of this issue in June.  She had evaluation including lab tests and an abdominal ultrasound apparently which was normal.  She was instructed to start Pepcid at the tail end of June and she is not really sure that this is helped this issue.  She does drink caffeine about 4 times per week.  Allergies as of 07/12/2022       Reactions   Fruit & Vegetable  Daily [nutritional Supplements] Itching   All fruit   Other    Seasonal Allergies        Medication List    AirDuo Digihaler 113-14 MCG/ACT Aepb Generic drug: Fluticasone-Salmeterol(sensor) Inhale one dose twice daily to prevent cough or wheeze.  Rinse, gargle, and spit after use.   albuterol 108 (90 Base) MCG/ACT inhaler Commonly known as: VENTOLIN HFA Inhale 2 puffs into the lungs every 4 (four) hours as needed for wheezing or shortness of breath.   Calcium/D3 Adult Gummies 200-96.6-200 MG-MG-UNIT Chew Generic drug: Calcium-Phosphorus-Vitamin D Chew 1 tablet by mouth daily.   cetirizine 10 MG tablet Commonly known as: ZYRTEC 1 tablet 1-2 times per day.   EPINEPHrine 0.3 mg/0.3 mL Soaj injection Commonly known as: Auvi-Q Inject 0.3 mg into the muscle as needed for anaphylaxis. As directed for life-threatening allergic reactions   ibuprofen 600 MG tablet Commonly known as: ADVIL Take 1 tablet (600 mg total) by mouth every 8 (eight) hours as needed for up to 30 doses for fever, headache, mild pain or moderate pain (Inflammation). Take 1 tablet 3 times daily as needed for inflammation of upper airways and/or pain.   medroxyPROGESTERone Acetate 150 MG/ML Susy Inject 1 mL (150 mg total) into the muscle once for 1 dose.   montelukast 10 MG tablet Commonly known as: SINGULAIR Take 10 mg by mouth at bedtime.   omeprazole 40 MG  capsule Commonly known as: PRILOSEC Take 1 capsule (40 mg total) by mouth daily.   ondansetron 4 MG disintegrating tablet Commonly known as: ZOFRAN-ODT Take 1 tablet (4 mg total) by mouth every 8 (eight) hours as needed for nausea or vomiting.   Orilissa 150 MG Tabs Generic drug: Elagolix Sodium Take 1 tablet by mouth daily.   promethazine 25 MG tablet Commonly known as: PHENERGAN Take 25 mg by mouth every 6 (six) hours as needed.   sodium fluoride 1.1 % Gel dental gel Commonly known as: FLUORISHIELD Take by mouth daily.    Past Medical  History:  Diagnosis Date   Asthma    Broken arm    Headache    Vaccine for human papilloma virus (HPV) types 6, 11, 16, and 18 administered     Past Surgical History:  Procedure Laterality Date   LAPAROSCOPY N/A 07/22/2020   Procedure: LAPAROSCOPY DIAGNOSTIC;  Surgeon: Malachy Mood, MD;  Location: [facility] ORS;  Service: Gynecology;  Laterality: N/A;   OTHER SURGICAL HISTORY Right 10/2011   Right thumb    Review of systems negative except as noted in HPI / PMHx or noted below:  Review of Systems  Constitutional: Negative.   HENT: Negative.    Eyes: Negative.   Respiratory: Negative.    Cardiovascular: Negative.   Gastrointestinal: Negative.   Genitourinary: Negative.   Musculoskeletal: Negative.   Skin: Negative.   Neurological: Negative.   Endo/Heme/Allergies: Negative.   Psychiatric/Behavioral: Negative.       Objective:   There were no vitals filed for this visit.        Physical Exam Constitutional:      Appearance: She is not diaphoretic.  HENT:     Head: Normocephalic.     Right Ear: Tympanic membrane, ear canal and external ear normal.     Left Ear: Tympanic membrane, ear canal and external ear normal.     Nose: Nose normal. No mucosal edema or rhinorrhea.     Mouth/Throat:     Pharynx: Uvula midline. No oropharyngeal exudate.  Eyes:     Conjunctiva/sclera: Conjunctivae normal.  Neck:     Thyroid: No thyromegaly.     Trachea: Trachea normal. No tracheal tenderness or tracheal deviation.  Cardiovascular:     Rate and Rhythm: Normal rate and regular rhythm.     Heart sounds: Normal heart sounds, S1 normal and S2 normal. No murmur heard. Pulmonary:     Effort: No respiratory distress.     Breath sounds: Normal breath sounds. No stridor. No wheezing or rales.  Lymphadenopathy:     Head:     Right side of head: No tonsillar adenopathy.     Left side of head: No tonsillar adenopathy.     Cervical: No cervical adenopathy.  Skin:    Findings: No  erythema or rash.     Nails: There is no clubbing.  Neurological:     Mental Status: She is alert.     Diagnostics:    Spirometry was performed and demonstrated an FEV1 of 2.14 at 74 % of predicted.  The patient had an Asthma Control Test with the following results:  .    Assessment and Plan:   1. Asthma, moderate persistent, well-controlled   2. Perennial allergic rhinitis   3. Seasonal allergic rhinitis due to pollen   4. Pollen-food allergy, initial encounter   5. LPRD (laryngopharyngeal reflux disease)   6. Nausea and vomiting, unspecified vomiting type     1.  Allergen  avoidance measures -dust mite, cat, dog, other mammals, pollens, foods that give rise to oral reaction  2.  Continue to treat and prevent inflammation:  A. Air Duo 113 - 1 inhalation 1-2 times per day  B. OTC Nasacort - 1 spray each nostril 3-7 times per week C. Montelukast 10 mg - 1 tablet 1 time per day  3.  Continue to treat and prevent reflux/LPR/chest pain:  A. Minimize all caffeine consumption B. INCREASE Omeprazole 40 mg - 1 tablet 2 time per day C. Can use OTC famotidine 20 mg 1 time per day in p.m.  4.  If needed:  A. Albuterol HFA - 2 inhalations every 4-6 hours B. Cetirizine 10 mg - 1 tablet 1-2 times per day C. Pataday - 1 drop each eye 1 time per day D. Auvi-Q 0.3, benadryl, MD/ER evaluation for allergic reaction  5.  May need to see gastroenterologist if recurrent nausea and vomiting or still an issue with therapy directed against reflux to rule out eosinophilic gastro esophagitis  6.  Return to clinic in 6 months or earlier if problem  7. Obtain fall flu vaccine  [name] is doing well from a respiratory standpoint and she will continue to use anti-inflammatory agents on a consistent basis.  She is not doing as well regarding her classic reflux and she has recurrent nausea and vomiting in the context of an apparent normal abdominal ultrasound and normal labs suggesting that she may  have a mucosal abnormality such as eosinophilic gastro esophagitis for which she should probably be evaluated by a GI specialist to have an upper endoscopy should this issue continue in the face of therapy noted above.  Allena Katz, MD Allergy / Immunology [place]

## 2022-07-12 NOTE — Patient Instructions (Addendum)
  1.  Allergen avoidance measures -dust mite, cat, dog, other mammals, pollens, foods that give rise to oral reaction  2.  Continue to treat and prevent inflammation:  A. Air Duo 113 - 1 inhalation 1-2 times per day  B. OTC Nasacort - 1 spray each nostril 3-7 times per week C. Montelukast 10 mg - 1 tablet 1 time per day  3.  Continue to treat and prevent reflux/LPR/chest pain:  A. Minimize all caffeine consumption B. INCREASE Omeprazole 40 mg - 1 tablet 2 time per day C. Can use OTC famotidine 20 mg 1 time per day in p.m.  4.  If needed:  A. Albuterol HFA - 2 inhalations every 4-6 hours B. Cetirizine 10 mg - 1 tablet 1-2 times per day C. Pataday - 1 drop each eye 1 time per day D. Auvi-Q 0.3, benadryl, MD/ER evaluation for allergic reaction  5.  May need to see gastroenterologist if recurrent nausea and vomiting or still an issue with therapy directed against reflux to rule out eosinophilic gastro esophagitis  6.  Return to clinic in 6 months or earlier if problem  7. Obtain fall flu vaccine

## 2022-07-13 ENCOUNTER — Encounter: Payer: Self-pay | Admitting: Allergy and Immunology

## 2022-07-18 ENCOUNTER — Ambulatory Visit
Admission: EM | Admit: 2022-07-18 | Discharge: 2022-07-18 | Disposition: A | Discharge status: Home / Self Care | Payer: Managed Care, Other (non HMO)

## 2022-07-18 ENCOUNTER — Encounter: Payer: Self-pay | Admitting: Emergency Medicine

## 2022-07-18 DIAGNOSIS — J069 Acute upper respiratory infection, unspecified: Secondary | ICD-10-CM

## 2022-07-18 DIAGNOSIS — J4521 Mild intermittent asthma with (acute) exacerbation: Secondary | ICD-10-CM | POA: Diagnosis not present

## 2022-07-18 MED ORDER — PROMETHAZINE-DM 6.25-15 MG/5ML PO SYRP: 5.0000 mL | ORAL_SOLUTION | Freq: Four times a day (QID) | ORAL | 0 refills | Status: DC | PRN

## 2022-07-18 MED ORDER — PREDNISONE 10 MG (21) PO TBPK
ORAL_TABLET | Freq: Every day | ORAL | 0 refills | Status: DC
Start: 2022-07-18 — End: 2022-08-11

## 2022-07-18 NOTE — ED Triage Notes (Signed)
Pt is present today with c/o cough, chest congestion, nasal congestion, and dizziness when she coughs. Pt sx started last Tuesday

## 2022-07-18 NOTE — Discharge Instructions (Signed)
Take prednisone as prescribed Can use cough syrup as needed Continue with nasocort and Mucinex Drink plenty of fluids, rest Return if symptoms become worse, follow up with allergist if needed

## 2022-07-18 NOTE — ED Provider Notes (Signed)
[facility]    CSN: [id number] Arrival date & time: 07/18/22  0803      History   Chief Complaint Chief Complaint  Patient presents with   Cough    Chest congestion   Nasal Congestion    HPI Amy [name] is a 21 y.o. female.   Pt complains of congestion and cough that started several days ago.  She was seen by her allergist on 7/24, no sx at this time, doing well.  She reports the next day she began experiencing a cough and two days later congestion started.  She has been taking mucinex and using her albuterol as needed with minimal relief.  She denies shortness of breath or wheezing.  She denies fever, chills, headache.  She reports chest wall pain from all the coughing.      Past Medical History:  Diagnosis Date   Asthma    Broken arm    Headache    Vaccine for human papilloma virus (HPV) types 6, 11, 16, and 18 administered     Patient Active Problem List   Diagnosis Date Noted   Cough 05/04/2022   Endometriosis determined by laparoscopy 09/04/2020   Chronic pelvic pain in female    Family history of breast cancer 06/26/2020   Dyspareunia in female 06/26/2020   LLQ pain 12/27/2019   Episodic tension-type headache, not intractable 02/06/2015   Tension type headache 05/14/2014   Anxiety state, unspecified 05/14/2014    Past Surgical History:  Procedure Laterality Date   LAPAROSCOPY N/A 07/22/2020   Procedure: LAPAROSCOPY DIAGNOSTIC;  Surgeon: Malachy Mood, MD;  Location: [facility] ORS;  Service: Gynecology;  Laterality: N/A;   OTHER SURGICAL HISTORY Right 10/2011   Right thumb    OB History     Gravida  0   Para  0   Term  0   Preterm  0   AB  0   Living  0      SAB  0   IAB  0   Ectopic  0   Multiple  0   Live Births  0            Home Medications    Prior to Admission medications   Medication Sig Start Date End Date Taking? Authorizing Provider  predniSONE (STERAPRED UNI-PAK 21 TAB) 10 MG (21) TBPK tablet Take  by mouth daily. Take 6 tabs by mouth daily  for 2 days, then 5 tabs for 2 days, then 4 tabs for 2 days, then 3 tabs for 2 days, 2 tabs for 2 days, then 1 tab by mouth daily for 2 days 07/18/22  Yes Ward, Lenise Arena, PA-C  promethazine-dextromethorphan (PROMETHAZINE-DM) 6.25-15 MG/5ML syrup Take 5 mLs by mouth 4 (four) times daily as needed for cough. 07/18/22  Yes Ward, Lenise Arena, PA-C  AIRDUO [facility] 433-29 MCG/ACT AEPB Inhale one dose twice daily to prevent cough or wheeze.  Rinse, gargle, and spit after use. 11/30/21   Kozlow, Donnamarie Poag, MD  albuterol (VENTOLIN HFA) 108 (90 Base) MCG/ACT inhaler Inhale 2 puffs into the lungs every 4 (four) hours as needed for wheezing or shortness of breath.  01/03/19 07/18/20  [provider]  Calcium-Phosphorus-Vitamin D (CALCIUM/D3 ADULT GUMMIES) 200-96.6-200 MG-MG-UNIT CHEW Chew 1 tablet by mouth daily.     [provider]  cetirizine (ZYRTEC) 10 MG tablet 1 tablet 1-2 times per day. 07/23/21   Kozlow, Donnamarie Poag, MD  DULoxetine (CYMBALTA) 30 MG capsule Take 30 mg by mouth  2 (two) times daily. 06/28/22   [provider]  Elagolix Sodium (ORILISSA) 150 MG TABS Take 1 tablet by mouth daily. 09/14/21   Malachy Mood, MD  EPINEPHrine (AUVI-Q) 0.3 mg/0.3 mL IJ SOAJ injection Inject 0.3 mg into the muscle as needed for anaphylaxis. As directed for life-threatening allergic reactions 07/23/21   Kozlow, Donnamarie Poag, MD  ibuprofen (ADVIL) 600 MG tablet Take 1 tablet (600 mg total) by mouth every 8 (eight) hours as needed for up to 30 doses for fever, headache, mild pain or moderate pain (Inflammation). Take 1 tablet 3 times daily as needed for inflammation of upper airways and/or pain. 01/01/22   Lynden Oxford Scales, PA-C  medroxyPROGESTERone Acetate 150 MG/ML SUSY Inject 1 mL (150 mg total) into the muscle once for 1 dose. 11/03/21 15/17/61  Copland, Deirdre Evener, PA-C  montelukast (SINGULAIR) 10 MG tablet Take 10 mg by mouth at bedtime.    [provider]   omeprazole (PRILOSEC) 40 MG capsule Take 1 capsule (40 mg total) by mouth daily. 07/23/21   Kozlow, Donnamarie Poag, MD  ondansetron (ZOFRAN-ODT) 4 MG disintegrating tablet Take 1 tablet (4 mg total) by mouth every 8 (eight) hours as needed for nausea or vomiting. 02/08/22   Teodora Medici, FNP  promethazine (PHENERGAN) 25 MG tablet Take 25 mg by mouth every 6 (six) hours as needed. 02/10/22   [provider]  sodium fluoride (FLUORISHIELD) 1.1 % GEL dental gel Take by mouth daily. 06/04/21   [provider]    Family History Family History  Problem Relation Age of Onset   Asthma Mother    Migraines Maternal Grandmother    Depression Maternal Grandmother    Anxiety disorder Maternal Grandmother    Diabetes Maternal Grandmother    Hypertension Maternal Grandmother    Breast cancer Paternal Grandmother        47s   Migraines Maternal Uncle        Had migraines as a teen   ADD / ADHD Maternal Uncle    Breast cancer Other 56   Stomach cancer Other    Colon cancer Other     Social History Social History   Tobacco Use   Smoking status: Never   Smokeless tobacco: Never  Vaping Use   Vaping Use: Never used  Substance Use Topics   Alcohol use: Yes    Comment: occasionally   Drug use: No     Allergies   Fruit & vegetable daily [nutritional supplements] and Other   Review of Systems Review of Systems  Constitutional:  Negative for chills and fever.  HENT:  Positive for congestion. Negative for ear pain and sore throat.   Eyes:  Negative for pain and visual disturbance.  Respiratory:  Positive for cough. Negative for shortness of breath.   Cardiovascular:  Negative for chest pain and palpitations.  Gastrointestinal:  Negative for abdominal pain and vomiting.  Genitourinary:  Negative for dysuria and hematuria.  Musculoskeletal:  Negative for arthralgias and back pain.  Skin:  Negative for color change and rash.  Neurological:  Negative for seizures and syncope.  All  other systems reviewed and are negative.    Physical Exam Triage Vital Signs ED Triage Vitals  Enc Vitals Group     BP 07/18/22 0815 139/86     Pulse Rate 07/18/22 0815 (!) 107     Resp 07/18/22 0815 19     Temp 07/18/22 0815 98.2 F (36.8 C)     Temp src --  SpO2 07/18/22 0815 98 %     Weight --      Height --      Head Circumference --      Peak Flow --      Pain Score 07/18/22 0814 0     Pain Loc --      Pain Edu? --      Excl. in [place]? --    No data found.  Updated Vital Signs BP 139/86   Pulse (!) 107   Temp 98.2 F (36.8 C)   Resp 19   SpO2 98%   Visual Acuity Right Eye Distance:   Left Eye Distance:   Bilateral Distance:    Right Eye Near:   Left Eye Near:    Bilateral Near:     Physical Exam Vitals and nursing note reviewed.  Constitutional:      General: She is not in acute distress.    Appearance: She is well-developed.  HENT:     Head: Normocephalic and atraumatic.     Nose: Congestion present.  Eyes:     Conjunctiva/sclera: Conjunctivae normal.  Cardiovascular:     Rate and Rhythm: Normal rate and regular rhythm.     Heart sounds: No murmur heard. Pulmonary:     Effort: Pulmonary effort is normal. No respiratory distress.     Breath sounds: Normal breath sounds.  Abdominal:     Palpations: Abdomen is soft.     Tenderness: There is no abdominal tenderness.  Musculoskeletal:        General: No swelling.     Cervical back: Neck supple.  Skin:    General: Skin is warm and dry.     Capillary Refill: Capillary refill takes less than 2 seconds.  Neurological:     Mental Status: She is alert.  Psychiatric:        Mood and Affect: Mood normal.      UC Treatments / Results  Labs (all labs ordered are listed, but only abnormal results are displayed) Labs Reviewed - No data to display  EKG   Radiology No results found.  Procedures Procedures (including critical care time)  Medications Ordered in UC Medications - No data to  display  Initial Impression / Assessment and Plan / UC Course  I have reviewed the triage vital signs and the nursing notes.  Pertinent labs & imaging results that were available during my care of the patient were reviewed by me and considered in my medical decision making (see chart for details).     Viral URI with asthma exacerbation.  No wheezing heard on exam, vitals wnl, overall well appearing; stable for discharge.  Advised continue use of albuterol as needed, nasocort, and allergy medication.  Will prescribe a course of steroids and cough syrup to use as needed.  Discussed follow up with allergist.  Final Clinical Impressions(s) / UC Diagnoses   Final diagnoses:  Acute upper respiratory infection  Mild intermittent asthma with acute exacerbation     Discharge Instructions      Take prednisone as prescribed Can use cough syrup as needed Continue with nasocort and Mucinex Drink plenty of fluids, rest Return if symptoms become worse, follow up with allergist if needed    ED Prescriptions     Medication Sig Dispense Auth. Provider   predniSONE (STERAPRED UNI-PAK 21 TAB) 10 MG (21) TBPK tablet Take by mouth daily. Take 6 tabs by mouth daily  for 2 days, then 5 tabs for 2 days, then 4  tabs for 2 days, then 3 tabs for 2 days, 2 tabs for 2 days, then 1 tab by mouth daily for 2 days 42 tablet Ward, Lenise Arena, PA-C   promethazine-dextromethorphan (PROMETHAZINE-DM) 6.25-15 MG/5ML syrup Take 5 mLs by mouth 4 (four) times daily as needed for cough. 118 mL Ward, Lenise Arena, PA-C      PDMP not reviewed this encounter.   Ward, Lenise Arena, PA-C 07/18/22 [phone]

## 2022-07-21 ENCOUNTER — Other Ambulatory Visit: Payer: Self-pay | Admitting: Allergy and Immunology

## 2022-07-28 ENCOUNTER — Encounter: Payer: Self-pay | Admitting: *Deleted

## 2022-08-11 ENCOUNTER — Ambulatory Visit
Admission: EM | Admit: 2022-08-11 | Discharge: 2022-08-11 | Disposition: A | Discharge status: Home / Self Care | Payer: Managed Care, Other (non HMO) | Attending: Family Medicine | Admitting: Family Medicine

## 2022-08-11 DIAGNOSIS — R519 Headache, unspecified: Secondary | ICD-10-CM

## 2022-08-11 MED ORDER — CYCLOBENZAPRINE HCL 10 MG PO TABS: ORAL_TABLET | ORAL | 0 refills | Status: DC

## 2022-08-11 NOTE — Discharge Instructions (Signed)
Please seek prompt medical care if: You have: A very bad (severe) headache that is not helped by medicine. Trouble walking or weakness in your arms and legs. Clear or bloody fluid coming from your nose or ears. Changes in your seeing (vision). Jerky movements that you cannot control (seizure). You throw up (vomit). You lose balance. Your speech is slurred. You pass out. You are sleepier and have trouble staying awake. The black centers of your eyes (pupils) change in size.  These symptoms may be an emergency. Do not wait to see if the symptoms will go away. Get medical help right away. Call your local emergency services. Do not drive yourself to the hospital.
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
## 2022-10-17 ENCOUNTER — Encounter: Payer: Self-pay | Admitting: Emergency Medicine

## 2022-10-17 ENCOUNTER — Other Ambulatory Visit: Payer: Self-pay

## 2022-10-17 ENCOUNTER — Ambulatory Visit
Admission: EM | Admit: 2022-10-17 | Discharge: 2022-10-17 | Disposition: A | Discharge status: Home / Self Care | Payer: Managed Care, Other (non HMO) | Attending: Emergency Medicine | Admitting: Emergency Medicine

## 2022-10-17 DIAGNOSIS — J45909 Unspecified asthma, uncomplicated: Secondary | ICD-10-CM

## 2022-10-17 DIAGNOSIS — J309 Allergic rhinitis, unspecified: Secondary | ICD-10-CM

## 2022-10-17 DIAGNOSIS — A084 Viral intestinal infection, unspecified: Secondary | ICD-10-CM

## 2022-10-17 MED ORDER — METHYLPREDNISOLONE SODIUM SUCC 125 MG IJ SOLR
80.0000 mg | Freq: Once | INTRAMUSCULAR | Status: AC
  Administered 2022-10-17: 80 mg via INTRAMUSCULAR

## 2022-10-17 MED ORDER — ALBUTEROL SULFATE HFA 108 (90 BASE) MCG/ACT IN AERS: 2.0000 | INHALATION_SPRAY | Freq: Four times a day (QID) | RESPIRATORY_TRACT | 0 refills | Status: DC | PRN

## 2022-10-17 MED ORDER — PROMETHAZINE HCL 25 MG PO TABS: 25.0000 mg | ORAL_TABLET | Freq: Four times a day (QID) | ORAL | 0 refills | Status: DC | PRN

## 2022-10-17 MED ORDER — ALBUTEROL SULFATE (2.5 MG/3ML) 0.083% IN NEBU
2.5000 mg | INHALATION_SOLUTION | Freq: Once | RESPIRATORY_TRACT | Status: AC
  Administered 2022-10-17: 2.5 mg via RESPIRATORY_TRACT

## 2022-10-17 MED ORDER — CETIRIZINE HCL 10 MG PO TABS: 10.0000 mg | ORAL_TABLET | Freq: Every day | ORAL | 1 refills | Status: AC

## 2022-10-17 MED ORDER — FLUTICASONE PROPIONATE 50 MCG/ACT NA SUSP: 1.0000 | Freq: Every day | NASAL | 2 refills | Status: DC

## 2022-10-17 MED ORDER — AEROCHAMBER PLUS FLO-VU MEDIUM MISC: 1.0000 | Freq: Once | 0 refills | Status: AC

## 2022-10-17 NOTE — ED Provider Notes (Signed)
[facility]    CSN: [id number] Arrival date & time: 10/17/22  1224    HISTORY   Chief Complaint  Patient presents with   Cough   Vomiting   HPI Amy [name] is a pleasant, 21 y.o. female who presents to urgent care today. Patient complains of cough and congestion for the past 3 days.  Patient states her cough is now hurting her chest.  Patient states she she vomited this morning.  Patient states her cough and congestion are not related to her known allergies.  Patient states she usually has a sinus headache when she has an exacerbation of her allergies.  Patient states her vomiting is not related to her prior history of frequent episodes of nausea and vomiting.  Patient states she is currently using Depo-Provera for birth control and states she knows she is not pregnant.  Patient states she is vomiting bile.  Patient states she is currently tolerating foods and liquids.  Patient states vomiting is not related to cough.  Patient states cough is not productive.  Patient states exertion makes her cough worse.  Patient states she is been coughing more at night.  Patient states she has a runny nose and drainage is clear.  Patient states her boyfriend, who is with her today, was sick last week with similar symptoms.  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  The history is provided by the patient.   Past Medical History:  Diagnosis Date   Asthma    Broken arm    Headache    Vaccine for human papilloma virus (HPV) types 6, 11, 16, and 18 administered    Patient Active Problem List   Diagnosis Date Noted   Cough 05/04/2022   Endometriosis determined by laparoscopy 09/04/2020   Chronic pelvic pain in female    Family history of breast cancer 06/26/2020   Dyspareunia in female 06/26/2020   LLQ pain 12/27/2019   Episodic tension-type headache, not intractable 02/06/2015   Tension type headache 05/14/2014   Anxiety state, unspecified 05/14/2014   Past Surgical  History:  Procedure Laterality Date   LAPAROSCOPY N/A 07/22/2020   Procedure: LAPAROSCOPY DIAGNOSTIC;  Surgeon: Malachy Mood, MD;  Location: [facility] OR[facility];  Service: Gynecology;  Laterality: N/A;   OTHER SURGICAL HISTORY Right 10/2011   Right thumb   OB History     Gravida  0   Para  0   Term  0   Preterm  0   AB  0   Living  0      SAB  0   IAB  0   Ectopic  0   Multiple  0   Live Births  0          Home Medications    Prior to Admission medications   Medication Sig Start Date End Date Taking? Authorizing Provider  Rosilyn Mings 113-14 MCG/ACT AEPB Inhale one dose twice daily to prevent cough or wheeze.  Rinse, gargle, and spit after use. 11/30/21   Kozlow, Donnamarie Poag, MD  albuterol (VENTOLIN HFA) 108 (90 Base) MCG/ACT inhaler Inhale 2 puffs into the lungs every 4 (four) hours as needed for wheezing or shortness of breath.  01/03/19 07/18/20  [provider]  Calcium-Phosphorus-Vitamin D (CALCIUM/D3 ADULT GUMMIES) 200-96.6-200 MG-MG-UNIT CHEW Chew 1 tablet by mouth daily.     [provider]  cetirizine (ZYRTEC) 10 MG tablet 1 tablet 1-2 times per day. 07/23/21   Kozlow, Donnamarie Poag, MD  cyclobenzaprine (FLEXERIL) 10 MG tablet Take 1 tablet by mouth 3 times daily as needed for muscle spasm. Warning: May cause drowsiness. 08/11/22   Vanessa Kick, MD  DULoxetine (CYMBALTA) 30 MG capsule Take 30 mg by mouth 2 (two) times daily. 06/28/22   [provider]  Elagolix Sodium (ORILISSA) 150 MG TABS Take 1 tablet by mouth daily. 09/14/21   Malachy Mood, MD  EPINEPHrine (AUVI-Q) 0.3 mg/0.3 mL IJ SOAJ injection Inject 0.3 mg into the muscle as needed for anaphylaxis. As directed for life-threatening allergic reactions 07/23/21   Kozlow, Donnamarie Poag, MD  ibuprofen (ADVIL) 600 MG tablet Take 1 tablet (600 mg total) by mouth every 8 (eight) hours as needed for up to 30 doses for fever, headache, mild pain or moderate pain (Inflammation). Take 1 tablet 3 times daily as  needed for inflammation of upper airways and/or pain. 01/01/22   Lynden Oxford Scales, PA-C  medroxyPROGESTERone Acetate 150 MG/ML SUSY Inject 1 mL (150 mg total) into the muscle once for 1 dose. 11/03/21 90/30/09  Copland, Deirdre Evener, PA-C  montelukast (SINGULAIR) 10 MG tablet Take 10 mg by mouth at bedtime.    [provider]  omeprazole (PRILOSEC) 40 MG capsule TAKE 1 CAPSULE(40 MG) BY MOUTH DAILY 07/21/22   Kozlow, Donnamarie Poag, MD    Family History Family History  Problem Relation Age of Onset   Asthma Mother    Migraines Maternal Grandmother    Depression Maternal Grandmother    Anxiety disorder Maternal Grandmother    Diabetes Maternal Grandmother    Hypertension Maternal Grandmother    Breast cancer Paternal Grandmother        33s   Migraines Maternal Uncle        Had migraines as a teen   ADD / ADHD Maternal Uncle    Breast cancer Other 20   Stomach cancer Other    Colon cancer Other    Social History Social History   Tobacco Use   Smoking status: Never   Smokeless tobacco: Never  Vaping Use   Vaping Use: Never used  Substance Use Topics   Alcohol use: Yes    Comment: occasionally   Drug use: No   Allergies   Fruit & vegetable daily [nutritional supplements] and Other  Review of Systems Review of Systems Pertinent findings revealed after performing a 14 point review of systems has been noted in the history of present illness.  Physical Exam Triage Vital Signs ED Triage Vitals  Enc Vitals Group     BP 10/16/21 0827 (!) 147/82     Pulse Rate 10/16/21 0827 72     Resp 10/16/21 0827 18     Temp 10/16/21 0827 98.3 F (36.8 C)     Temp Source 10/16/21 0827 Oral     SpO2 10/16/21 0827 98 %     Weight --      Height --      Head Circumference --      Peak Flow --      Pain Score 10/16/21 0826 5     Pain Loc --      Pain Edu? --      Excl. in [place]? --   No data found.  Updated Vital Signs BP 127/79 (BP Location: Left Arm)   Pulse 81   Temp 98.1 F  (36.7 C) (Oral)   Resp 18   SpO2 98%   Physical Exam Vitals and nursing note reviewed.  Constitutional:      General:  She is not in acute distress.    Appearance: Normal appearance. She is not ill-appearing.  HENT:     Head: Normocephalic and atraumatic.     Salivary Glands: Right salivary gland is not diffusely enlarged or tender. Left salivary gland is not diffusely enlarged or tender.     Right Ear: Ear canal and external ear normal. No drainage. A middle ear effusion is present. There is no impacted cerumen. Tympanic membrane is bulging. Tympanic membrane is not injected or erythematous.     Left Ear: Ear canal and external ear normal. No drainage. A middle ear effusion is present. There is no impacted cerumen. Tympanic membrane is bulging. Tympanic membrane is not injected or erythematous.     Ears:     Comments: Bilateral EACs normal, both TMs bulging with clear fluid    Nose: Rhinorrhea present. No nasal deformity, septal deviation, signs of injury, nasal tenderness, mucosal edema or congestion. Rhinorrhea is clear.     Right Nostril: Occlusion present. No foreign body, epistaxis or septal hematoma.     Left Nostril: Occlusion present. No foreign body, epistaxis or septal hematoma.     Right Turbinates: Enlarged, swollen and pale.     Left Turbinates: Enlarged, swollen and pale.     Right Sinus: No maxillary sinus tenderness or frontal sinus tenderness.     Left Sinus: No maxillary sinus tenderness or frontal sinus tenderness.     Mouth/Throat:     Lips: Pink. No lesions.     Mouth: Mucous membranes are moist. No oral lesions.     Pharynx: Oropharynx is clear. Uvula midline. No posterior oropharyngeal erythema or uvula swelling.     Tonsils: No tonsillar exudate. 0 on the right. 0 on the left.     Comments: Postnasal drip Eyes:     General: Lids are normal.        Right eye: No discharge.        Left eye: No discharge.     Extraocular Movements: Extraocular movements intact.      Conjunctiva/sclera: Conjunctivae normal.     Right eye: Right conjunctiva is not injected.     Left eye: Left conjunctiva is not injected.  Neck:     Trachea: Trachea and phonation normal.  Cardiovascular:     Rate and Rhythm: Normal rate and regular rhythm.     Pulses: Normal pulses.     Heart sounds: Normal heart sounds. No murmur heard.    No friction rub. No gallop.  Pulmonary:     Effort: Pulmonary effort is normal. No accessory muscle usage, prolonged expiration or respiratory distress.     Breath sounds: No stridor, decreased air movement or transmitted upper airway sounds. Examination of the right-middle field reveals decreased breath sounds. Examination of the left-middle field reveals decreased breath sounds. Examination of the right-lower field reveals decreased breath sounds. Examination of the left-lower field reveals decreased breath sounds. Decreased breath sounds present. No wheezing, rhonchi or rales.     Comments: Postbronchodilator treatment revealed improved breath sounds with mild wheezing and right lower lung fields Chest:     Chest wall: No tenderness.  Musculoskeletal:        General: Normal range of motion.     Cervical back: Normal range of motion and neck supple. Normal range of motion.  Lymphadenopathy:     Cervical: No cervical adenopathy.  Skin:    General: Skin is warm and dry.     Findings: No erythema or rash.  Neurological:     General: No focal deficit present.     Mental Status: She is alert and oriented to person, place, and time.  Psychiatric:        Mood and Affect: Mood normal.        Behavior: Behavior normal.     Visual Acuity Right Eye Distance:   Left Eye Distance:   Bilateral Distance:    Right Eye Near:   Left Eye Near:    Bilateral Near:     UC Couse / Diagnostics / Procedures:     Radiology No results found.  Procedures Procedures (including critical care time) EKG  Pending results:  Labs Reviewed - No data to  display  Medications Ordered in UC: Medications  albuterol (PROVENTIL) (2.5 MG/3ML) 0.083% nebulizer solution 2.5 mg (2.5 mg Nebulization Given 10/17/22 1349)  methylPREDNISolone sodium succinate (SOLU-MEDROL) 125 mg/2 mL injection 80 mg (80 mg Intramuscular Given 10/17/22 1415)    UC Diagnoses / Final Clinical Impressions(s)   I have reviewed the triage vital signs and the nursing notes.  Pertinent labs & imaging results that were available during my care of the patient were reviewed by me and considered in my medical decision making (see chart for details).    Final diagnoses:  Allergic rhinitis, unspecified seasonality, unspecified trigger  Viral gastroenteritis  Reactive airway disease without complication, unspecified asthma severity, unspecified whether persistent   Patient states she has taken her last promethazine tablet and would like a refill.  I provided this for her at her request.  Patient adds that she is unable to take Zofran but does not specify the reason for this.  Patient states she took Promethazine DM for her cough which did not help.  Patient has not significantly improved breath sounds after nebulized albuterol treatment during her visit today mild wheezing was also revealed in her right lower quadrant.  Patient therefore provided with a prescription for albuterol inhaler and spacer.  Patient also advised that given her history of allergies, even though she does not feel that this is the underlying cause of her symptoms at this time, I recommend that she resume both cetirizine and Flonase for relief of upper and lower respiratory symptoms.  Given duration of symptoms, do not recommend viral testing as antiviral therapy is no longer indicated.  Physical exam findings do not reveal need for antibiotics at this time.  Return precautions advised.  ED Prescriptions     Medication Sig Dispense Auth. Provider   albuterol (VENTOLIN HFA) 108 (90 Base) MCG/ACT inhaler Inhale 2  puffs into the lungs every 6 (six) hours as needed for wheezing or shortness of breath (Cough). 18 g Lynden Oxford Scales, PA-C   Spacer/Aero-Holding Chambers (AEROCHAMBER PLUS FLO-VU MEDIUM) MISC 1 each by Other route once for 1 dose. 1 each Lynden Oxford Scales, PA-C   promethazine (PHENERGAN) 25 MG tablet Take 1 tablet (25 mg total) by mouth every 6 (six) hours as needed for nausea or vomiting. 30 tablet Lynden Oxford Scales, PA-C   cetirizine (ZYRTEC ALLERGY) 10 MG tablet Take 1 tablet (10 mg total) by mouth at bedtime. 90 tablet Lynden Oxford Scales, PA-C   fluticasone (FLONASE) 50 MCG/ACT nasal spray Place 1 spray into both nostrils daily. Begin by using 2 sprays in each nare daily for 3 to 5 days, then decrease to 1 spray in each nare daily. 15.8 mL Lynden Oxford Scales, PA-C      PDMP not reviewed this encounter.  Disposition Upon Discharge:  Condition: stable for discharge home Home: take medications as prescribed; routine discharge instructions as discussed; follow up as advised.  Patient presented with an acute illness with associated systemic symptoms and significant discomfort requiring urgent management. In my opinion, this is a condition that a prudent lay person (someone who possesses an average knowledge of health and medicine) may potentially expect to result in complications if not addressed urgently such as respiratory distress, impairment of bodily function or dysfunction of bodily organs.   Routine symptom specific, illness specific and/or disease specific instructions were discussed with the patient and/or caregiver at length.   As such, the patient has been evaluated and assessed, work-up was performed and treatment was provided in alignment with urgent care protocols and evidence based medicine.  Patient/parent/caregiver has been advised that the patient may require follow up for further testing and treatment if the symptoms continue in spite of treatment, as  clinically indicated and appropriate.  If the patient was tested for COVID-19, Influenza and/or RSV, then the patient/parent/guardian was advised to isolate at home pending the results of his/her diagnostic coronavirus test and potentially longer if they're positive. I have also advised pt that if his/her COVID-19 test returns positive, it's recommended to self-isolate for at least 10 days after symptoms first appeared AND until fever-free for 24 hours without fever reducer AND other symptoms have improved or resolved. Discussed self-isolation recommendations as well as instructions for household member/close contacts as per the [facility] and  [facility], and also gave patient the [place] packet with this information.  Patient/parent/caregiver has been advised to return to the [facility] or PCP in 3-5 days if no better; to PCP or the Emergency Department if new signs and symptoms develop, or if the current signs or symptoms continue to change or worsen for further workup, evaluation and treatment as clinically indicated and appropriate  The patient will follow up with their current PCP if and as advised. If the patient does not currently have a PCP we will assist them in obtaining one.   The patient may need specialty follow up if the symptoms continue, in spite of conservative treatment and management, for further workup, evaluation, consultation and treatment as clinically indicated and appropriate.  Patient/parent/caregiver verbalized understanding and agreement of plan as discussed.  All questions were addressed during visit.  Please see discharge instructions below for further details of plan.  Discharge Instructions:   Discharge Instructions      Your symptoms and my physical exam findings are concerning for exacerbation of your underlying allergies.     Please see the list below for recommended medications, dosages and frequencies to provide relief of current symptoms:     Solu-Medrol IM  (methylprednisolone):  To quickly address your significant respiratory inflammation, you were provided with an injection of Solu-Medrol in the office today.  You should continue to feel the full benefit of the steroid for the next 4 to 6 hours.    Zyrtec (cetirizine): This is an excellent second-generation antihistamine that helps to reduce respiratory inflammatory response to environmental allergens.  In some patients, this medication can cause daytime sleepiness so I recommend that you take 1 tablet daily at bedtime.     Flonase (fluticasone): This is a steroid nasal spray that you use once daily, 1 spray in each nare.  This medication does not work well if you decide to use it only used as you feel you need to, it works best used on a daily basis.  After 3 to 5  days of use, you will notice significant reduction of the inflammation and mucus production that is currently being caused by exposure to allergens, whether seasonal or environmental.  The most common side effect of this medication is nosebleeds.  If you experience a nosebleed, please discontinue use for 1 week, then feel free to resume.  I have provided you with a prescription.     ProAir, Ventolin, Proventil (albuterol): This inhaled medication contains a short acting beta agonist bronchodilator.  This medication works on the smooth muscle that opens and constricts of your airways by relaxing the muscle.  The result of relaxation of the smooth muscle is increased air movement and improved work of breathing.  This is a short acting medication that can be used every 4-6 hours as needed for increased work of breathing, shortness of breath, wheezing and excessive coughing.  I have provided you with a prescription.    Phenergan (promethazine): Promethazine is both a nasal decongestant and an antinausea medication that makes most patients feel fairly sleepy.  Please take 1 tablet every 6 hours as needed for nausea.   If you find that your health  insurance will not pay for allergy medications, please consider downloading the GoodRx app and using to get a better price than the "off the shelf" price.     Please follow-up within the next 5-7 days either with your primary care provider or urgent care if your symptoms do not resolve.  If you do not have a primary care provider, we will assist you in finding one.        Thank you for visiting urgent care today.  We appreciate the opportunity to participate in your care.         This office note has been dictated using Museum/gallery curator.  Unfortunately, this method of dictation can sometimes lead to typographical or grammatical errors.  I apologize for your inconvenience in advance if this occurs.  Please do not hesitate to reach out to me if clarification is needed.      Lynden Oxford Scales, [place] 10/18/22 [phone]

## 2022-10-17 NOTE — Discharge Instructions (Signed)
Your symptoms and my physical exam findings are concerning for exacerbation of your underlying allergies.     Please see the list below for recommended medications, dosages and frequencies to provide relief of current symptoms:     Solu-Medrol IM (methylprednisolone):  To quickly address your significant respiratory inflammation, you were provided with an injection of Solu-Medrol in the office today.  You should continue to feel the full benefit of the steroid for the next 4 to 6 hours.    Zyrtec (cetirizine): This is an excellent second-generation antihistamine that helps to reduce respiratory inflammatory response to environmental allergens.  In some patients, this medication can cause daytime sleepiness so I recommend that you take 1 tablet daily at bedtime.     Flonase (fluticasone): This is a steroid nasal spray that you use once daily, 1 spray in each nare.  This medication does not work well if you decide to use it only used as you feel you need to, it works best used on a daily basis.  After 3 to 5 days of use, you will notice significant reduction of the inflammation and mucus production that is currently being caused by exposure to allergens, whether seasonal or environmental.  The most common side effect of this medication is nosebleeds.  If you experience a nosebleed, please discontinue use for 1 week, then feel free to resume.  I have provided you with a prescription.     ProAir, Ventolin, Proventil (albuterol): This inhaled medication contains a short acting beta agonist bronchodilator.  This medication works on the smooth muscle that opens and constricts of your airways by relaxing the muscle.  The result of relaxation of the smooth muscle is increased air movement and improved work of breathing.  This is a short acting medication that can be used every 4-6 hours as needed for increased work of breathing, shortness of breath, wheezing and excessive coughing.  I have provided you with a  prescription.    Phenergan (promethazine): Promethazine is both a nasal decongestant and an antinausea medication that makes most patients feel fairly sleepy.  Please take 1 tablet every 6 hours as needed for nausea.   If you find that your health insurance will not pay for allergy medications, please consider downloading the GoodRx app and using to get a better price than the "off the shelf" price.     Please follow-up within the next 5-7 days either with your primary care provider or urgent care if your symptoms do not resolve.  If you do not have a primary care provider, we will assist you in finding one.        Thank you for visiting urgent care today.  We appreciate the opportunity to participate in your care.

## 2022-10-17 NOTE — ED Triage Notes (Signed)
Pt here for cough and congestion and pain with cough x 3 days; pt also sts vomiting

## 2023-01-07 ENCOUNTER — Other Ambulatory Visit: Payer: Self-pay

## 2023-01-10 ENCOUNTER — Ambulatory Visit: Payer: Managed Care, Other (non HMO) | Admitting: Gastroenterology

## 2023-01-10 ENCOUNTER — Encounter: Payer: Self-pay | Admitting: Gastroenterology

## 2023-01-10 VITALS — BP 131/85 | HR 94 | Temp 98.4°F | Ht 61.0 in | Wt 143.5 lb

## 2023-01-10 DIAGNOSIS — R1013 Epigastric pain: Secondary | ICD-10-CM | POA: Diagnosis not present

## 2023-01-10 DIAGNOSIS — R112 Nausea with vomiting, unspecified: Secondary | ICD-10-CM

## 2023-01-10 DIAGNOSIS — J309 Allergic rhinitis, unspecified: Secondary | ICD-10-CM | POA: Insufficient documentation

## 2023-01-10 NOTE — Progress Notes (Signed)
Cephas Darby, MD [address]  [place], [place] [address]  Main: [phone]  Fax: [phone]    Gastroenterology Consultation  Referring Provider:     Wayland Denis, PA-C Primary Care Physician:  Wayland Denis, PA-C Primary Gastroenterologist:  Dr. Cephas Darby Reason for Consultation: Chronic nausea, vomiting, upper abdominal pain        HPI:   Amy [name] is a 22 y.o. female referred by Wayland Denis, PA-C  for consultation & management of chronic symptoms of nausea and vomiting.  Patient reports that her symptoms started sometime in April last year, describes them as sporadic, no particular relation to food, this is dense lasted for 3 weeks initially, was taking Zofran which did not help much.  Recurred in June lasted for 2 weeks.  The symptoms of nausea and vomiting, sometimes associated with right upper quadrant discomfort.  Patient denies any melena, coffee-ground emesis, abdominal bloating.  Labs including serum lipase, pregnancy test, CMP and CBC were unremarkable end of June 2023, performed by her PCP.  She underwent right upper quadrant ultrasound which was unremarkable.  Her symptoms recurred in December, therefore referred to GI for further evaluation.  Repeat labs including CBC, CMP and lipase were unremarkable.  Patient was started on omeprazole 40 mg twice daily in August 2023 by an allergist along with famotidine.  She is currently taking it once a day along with famotidine daily.  She is taking Phenergan as needed.  She is currently working as a Quarry manager at [employer] and planning to attend the course for sonography.  Patient does not smoke or drink alcohol  NSAIDs: None  Antiplts/Anticoagulants/Anti thrombotics: None  GI Procedures: None  Past Medical History:  Diagnosis Date   Asthma    Broken arm    Headache    Vaccine for human papilloma virus (HPV) types 6, 11, 16, and 18 administered     Past Surgical History:  Procedure  Laterality Date   LAPAROSCOPY N/A 07/22/2020   Procedure: LAPAROSCOPY DIAGNOSTIC;  Surgeon: Malachy Mood, MD;  Location: [facility] ORS;  Service: Gynecology;  Laterality: N/A;   OTHER SURGICAL HISTORY Right 10/2011   Right thumb     Current Outpatient Medications:    AIRDUO DIGIHALER 113-14 MCG/ACT AEPB, Inhale one dose twice daily to prevent cough or wheeze.  Rinse, gargle, and spit after use., Disp: 1 each, Rfl: 5   albuterol (VENTOLIN HFA) 108 (90 Base) MCG/ACT inhaler, Inhale 2 puffs into the lungs every 6 (six) hours as needed for wheezing or shortness of breath (Cough)., Disp: 18 g, Rfl: 0   Calcium-Phosphorus-Vitamin D (CALCIUM/D3 ADULT GUMMIES) 200-96.6-200 MG-MG-UNIT CHEW, Chew 1 tablet by mouth daily. , Disp: , Rfl:    cetirizine (ZYRTEC ALLERGY) 10 MG tablet, Take 1 tablet (10 mg total) by mouth at bedtime., Disp: 90 tablet, Rfl: 1   DULoxetine (CYMBALTA) 30 MG capsule, Take 30 mg by mouth 2 (two) times daily., Disp: , Rfl:    EPINEPHrine (AUVI-Q) 0.3 mg/0.3 mL IJ SOAJ injection, Inject 0.3 mg into the muscle as needed for anaphylaxis. As directed for life-threatening allergic reactions, Disp: 2 each, Rfl: 1   medroxyPROGESTERone Acetate 150 MG/ML SUSY, Inject 1 mL (150 mg total) into the muscle once for 1 dose., Disp: 1 mL, Rfl: 0   montelukast (SINGULAIR) 10 MG tablet, Take 10 mg by mouth at bedtime., Disp: , Rfl:    omeprazole (PRILOSEC) 40 MG capsule, TAKE 1 CAPSULE(40 MG) BY MOUTH DAILY,  Disp: 30 capsule, Rfl: 5   promethazine (PHENERGAN) 25 MG tablet, Take 1 tablet (25 mg total) by mouth every 6 (six) hours as needed for nausea or vomiting., Disp: 30 tablet, Rfl: 0   Family History  Problem Relation Age of Onset   Asthma Mother    Migraines Maternal Grandmother    Depression Maternal Grandmother    Anxiety disorder Maternal Grandmother    Diabetes Maternal Grandmother    Hypertension Maternal Grandmother    Breast cancer Paternal Grandmother        19s   Migraines  Maternal Uncle        Had migraines as a teen   ADD / ADHD Maternal Uncle    Breast cancer Other 30   Stomach cancer Other    Colon cancer Other      Social History   Tobacco Use   Smoking status: Never   Smokeless tobacco: Never  Vaping Use   Vaping Use: Never used  Substance Use Topics   Alcohol use: Yes    Comment: occasionally   Drug use: No    Allergies as of 01/10/2023 - Review Complete 10/17/2022  Allergen Reaction Noted   Fruit & vegetable daily [nutritional supplements] Itching 07/16/2020   Other  05/14/2014    Review of Systems:    All systems reviewed and negative except where noted in HPI.   Physical Exam:  BP 131/85 (BP Location: Left Arm, Patient Position: Sitting, Cuff Size: Normal)   Pulse 94   Temp 98.4 F (36.9 C) (Oral)   Ht '5\' 1"'$  (1.549 m)   Wt 143 lb 8 oz (65.1 kg)   BMI 27.11 kg/m  No LMP recorded. Patient has had an injection.  General:   Alert,  Well-developed, well-nourished, pleasant and cooperative in NAD Head:  Normocephalic and atraumatic. Eyes:  Sclera clear, no icterus.   Conjunctiva pink. Ears:  Normal auditory acuity. Nose:  No deformity, discharge, or lesions. Mouth:  No deformity or lesions,oropharynx pink & moist. Neck:  Supple; no masses or thyromegaly. Lungs:  Respirations even and unlabored.  Clear throughout to auscultation.   No wheezes, crackles, or rhonchi. No acute distress. Heart:  Regular rate and rhythm; no murmurs, clicks, rubs, or gallops. Abdomen:  Normal bowel sounds. Soft, non-tender and non-distended without masses, hepatosplenomegaly or hernias noted.  No guarding or rebound tenderness.   Rectal: Not performed Msk:  Symmetrical without gross deformities. Good, equal movement & strength bilaterally. Pulses:  Normal pulses noted. Extremities:  No clubbing or edema.  No cyanosis. Neurologic:  Alert and oriented x3;  grossly normal neurologically. Skin:  Intact without significant lesions or rashes. No  jaundice. Psych:  Alert and cooperative. Normal mood and affect.  Imaging Studies: Reviewed  Assessment and Plan:   Amy [name] is a 21 y.o. pleasant Caucasian female with no significant past medical history seen in consultation for chronic symptoms of intermittent episodes of nausea, vomiting, upper abdominal discomfort.  Her labs were unremarkable.  Right upper quadrant ultrasound is normal.  No other alarm signs or symptoms  Recommend H. pylori breath test off PPI in 2 weeks.  If H. pylori breath test is negative, recommend upper endoscopy for further evaluation  Follow up based on the above workup, contact via MyChart as needed   Cephas Darby, MD

## 2023-01-10 NOTE — Patient Instructions (Signed)
Please stop the omeprazole '40mg'$  for 2 weeks and then come back to our lab for the H pylori breath test. Our lab is open on Mondays 1pm to 4pm, Tuesdays 8:30am to 4pm. Wednesdays 8:30am to 4pm and thursdays 1pm to 4pm.

## 2023-01-12 ENCOUNTER — Ambulatory Visit: Payer: Managed Care, Other (non HMO) | Admitting: Allergy and Immunology

## 2023-01-17 ENCOUNTER — Ambulatory Visit (INDEPENDENT_AMBULATORY_CARE_PROVIDER_SITE_OTHER): Payer: Managed Care, Other (non HMO) | Admitting: Allergy and Immunology

## 2023-01-17 VITALS — BP 104/64 | HR 76 | Resp 18 | Ht 61.0 in | Wt 145.6 lb

## 2023-01-17 DIAGNOSIS — J3089 Other allergic rhinitis: Secondary | ICD-10-CM | POA: Diagnosis not present

## 2023-01-17 DIAGNOSIS — K219 Gastro-esophageal reflux disease without esophagitis: Secondary | ICD-10-CM

## 2023-01-17 DIAGNOSIS — J4540 Moderate persistent asthma, uncomplicated: Secondary | ICD-10-CM

## 2023-01-17 DIAGNOSIS — T781XXD Other adverse food reactions, not elsewhere classified, subsequent encounter: Secondary | ICD-10-CM | POA: Diagnosis not present

## 2023-01-17 DIAGNOSIS — J301 Allergic rhinitis due to pollen: Secondary | ICD-10-CM

## 2023-01-17 MED ORDER — AIRDUO DIGIHALER 113-14 MCG/ACT IN AEPB: INHALATION_SPRAY | RESPIRATORY_TRACT | 5 refills | Status: DC

## 2023-01-17 NOTE — Progress Notes (Unsigned)
[facility] [place] [facility] [place]   Follow-up Note  Referring Provider: Wayland Denis, PA-C Primary Provider: Wayland Denis, PA-C Date of Office Visit: 01/17/2023  Subjective:   Amy [name] (DOB: 2001/08/10) is a 22 y.o. female who returns to the [facility] on 01/17/2023 in re-evaluation of the following:  HPI: Amy [name] returns to the clinic in evaluation of asthma, allergic rhinitis, oral allergy syndrome, anosmia, and LPR.  I last saw her in this clinic 12 July 2022.  She has done very well with her airway and has not required a systemic steroid or antibiotic for any type of airway issue.  Rarely does she require a short acting bronchodilator.  She still cannot smell.  She has been consistently using a nasal steroid, a combination inhaler, and montelukast.  She continues to have significant GI issues and she is now seeing a gastroenterologist who is assessing her for possible Helicobacter pylori infection with a anticipated breath test in the next few weeks and then moving onto an upper endoscopy if that test is negative.  She continues to have problems eating specific foods.  She has found that her food repertoire giving rise to reactivity is increasing.  She has obtained a flu vaccine.  Allergies as of 01/17/2023       Reactions   Fruit & Vegetable Daily [nutritional Supplements] Itching   All fruit   Other    Seasonal Allergies        Medication List    AirDuo Digihaler 113-14 MCG/ACT Aepb Generic drug: Fluticasone-Salmeterol(sensor) Inhale one dose twice daily to prevent cough or wheeze.  Rinse, gargle, and spit after use.   albuterol 108 (90 Base) MCG/ACT inhaler Commonly known as: VENTOLIN HFA Inhale 2 puffs into the lungs every 6 (six) hours as needed for wheezing or shortness of breath (Cough).   Calcium/D3 Adult Gummies 200-96.6-200 MG-MG-UNIT Chew Generic drug: Calcium-Phosphorus-Vitamin D Chew 1 tablet by  mouth daily.   cetirizine 10 MG tablet Commonly known as: ZyrTEC Allergy Take 1 tablet (10 mg total) by mouth at bedtime.   DULoxetine 30 MG capsule Commonly known as: CYMBALTA Take 30 mg by mouth 2 (two) times daily.   EPINEPHrine 0.3 mg/0.3 mL Soaj injection Commonly known as: Auvi-Q Inject 0.3 mg into the muscle as needed for anaphylaxis. As directed for life-threatening allergic reactions   medroxyPROGESTERone Acetate 150 MG/ML Susy Inject 1 mL (150 mg total) into the muscle once for 1 dose.   montelukast 10 MG tablet Commonly known as: SINGULAIR Take 10 mg by mouth at bedtime.   omeprazole 40 MG capsule Commonly known as: PRILOSEC TAKE 1 CAPSULE(40 MG) BY MOUTH DAILY   promethazine 25 MG tablet Commonly known as: PHENERGAN Take 1 tablet (25 mg total) by mouth every 6 (six) hours as needed for nausea or vomiting.    Past Medical History:  Diagnosis Date   Asthma    Broken arm    Headache    Vaccine for human papilloma virus (HPV) types 6, 11, 16, and 18 administered     Past Surgical History:  Procedure Laterality Date   LAPAROSCOPY N/A 07/22/2020   Procedure: LAPAROSCOPY DIAGNOSTIC;  Surgeon: Malachy Mood, MD;  Location: [facility] ORS;  Service: Gynecology;  Laterality: N/A;   OTHER SURGICAL HISTORY Right 10/2011   Right thumb    Review of systems negative except as noted in HPI / PMHx or noted below:  Review of Systems  Constitutional: Negative.   HENT:  Negative.    Eyes: Negative.   Respiratory: Negative.    Cardiovascular: Negative.   Gastrointestinal: Negative.   Genitourinary: Negative.   Musculoskeletal: Negative.   Skin: Negative.   Neurological: Negative.   Endo/Heme/Allergies: Negative.   Psychiatric/Behavioral: Negative.       Objective:   Vitals:   01/17/23 1101  BP: 104/64  Pulse: 76  Resp: 18  SpO2: 98%   Height: '5\' 1"'$  (154.9 cm)  Weight: 145 lb 9.6 oz (66 kg)   Physical Exam Constitutional:      Appearance: She is not  diaphoretic.  HENT:     Head: Normocephalic.     Right Ear: Tympanic membrane, ear canal and external ear normal.     Left Ear: Tympanic membrane, ear canal and external ear normal.     Nose: Nose normal. No mucosal edema or rhinorrhea.     Mouth/Throat:     Pharynx: Uvula midline. No oropharyngeal exudate.  Eyes:     Conjunctiva/sclera: Conjunctivae normal.  Neck:     Thyroid: No thyromegaly.     Trachea: Trachea normal. No tracheal tenderness or tracheal deviation.  Cardiovascular:     Rate and Rhythm: Normal rate and regular rhythm.     Heart sounds: Normal heart sounds, S1 normal and S2 normal. No murmur heard. Pulmonary:     Effort: No respiratory distress.     Breath sounds: Normal breath sounds. No stridor. No wheezing or rales.  Lymphadenopathy:     Head:     Right side of head: No tonsillar adenopathy.     Left side of head: No tonsillar adenopathy.     Cervical: No cervical adenopathy.  Skin:    Findings: No erythema or rash.     Nails: There is no clubbing.  Neurological:     Mental Status: She is alert.     Diagnostics:    Spirometry was performed and demonstrated an FEV1 of 1.95 at 77 % of predicted.  The patient had an Asthma Control Test with the following results:  .    Assessment and Plan:   1. Asthma, moderate persistent, well-controlled   2. Perennial allergic rhinitis   3. Seasonal allergic rhinitis due to pollen   4. Pollen-food allergy, initial encounter   5. Gastroesophageal reflux disease, unspecified whether esophagitis present    1.  Allergen avoidance measures -dust mite, cat, dog, other mammals, pollens, foods that give rise to oral reaction  2.  Continue to treat and prevent inflammation:  A. Air Duo 113 - 1 inhalation 1-2 times per day  B. OTC Nasacort - 1 spray each nostril 3-7 times per week C. Montelukast 10 mg - 1 tablet 1 time per day  3.  Continue to treat and prevent reflux/LPR/chest pain:  A. Follow up GI evaluation  4.   If needed:  A. Albuterol HFA - 2 inhalations every 4-6 hours B. Cetirizine 10 mg - 1 tablet 1-2 times per day C. Pataday - 1 drop each eye 1 time per day D. Auvi-Q 0.3, benadryl, MD/ER evaluation for allergic reaction  5.  Consider starting a course of immunotherapy  6.  Return to clinic in 6 months or earlier if problem  Amy [name] is doing very well from a respiratory standpoint and she will continue on her anti-inflammatory therapy for her airway as noted above.  She is a very atopic individual and she appears to be developing little bit more problem with her oral allergy syndrome and I think it be  worthwhile for her to consider starting a course of immunotherapy to address not just her airway atopic disease but also her oral allergy syndrome.  We gave her literature on this form of therapy during today's evaluation.  She will follow-up with GI regarding further evaluation of her GI issues.  If she does have eosinophilic esophagitis I should probably see her back in this clinic for further evaluation of that issue.  Allena Katz, MD Allergy / Immunology [place]

## 2023-01-17 NOTE — Patient Instructions (Addendum)
  1.  Allergen avoidance measures -dust mite, cat, dog, other mammals, pollens, foods that give rise to oral reaction  2.  Continue to treat and prevent inflammation:  A. Air Duo 113 - 1 inhalation 1-2 times per day  B. OTC Nasacort - 1 spray each nostril 3-7 times per week C. Montelukast 10 mg - 1 tablet 1 time per day  3.  Continue to treat and prevent reflux/LPR/chest pain:  A. Follow up GI evaluation  4.  If needed:  A. Albuterol HFA - 2 inhalations every 4-6 hours B. Cetirizine 10 mg - 1 tablet 1-2 times per day C. Pataday - 1 drop each eye 1 time per day D. Auvi-Q 0.3, benadryl, MD/ER evaluation for allergic reaction  5.  Consider starting a course of immunotherapy  6.  Return to clinic in 6 months or earlier if problem

## 2023-01-18 ENCOUNTER — Encounter: Payer: Self-pay | Admitting: Allergy and Immunology

## 2023-01-20 HISTORY — PX: UPPER GI ENDOSCOPY: SHX6162

## 2023-01-26 LAB — H. PYLORI BREATH TEST: H pylori Breath Test: NEGATIVE

## 2023-01-27 ENCOUNTER — Telehealth: Payer: Self-pay

## 2023-01-27 ENCOUNTER — Other Ambulatory Visit: Payer: Self-pay

## 2023-01-27 DIAGNOSIS — R1013 Epigastric pain: Secondary | ICD-10-CM

## 2023-01-27 NOTE — Telephone Encounter (Signed)
-----   Message from [name], MD sent at 01/26/2023  4:46 PM EST ----- Please inform patient that the H. pylori breath test came back negative.  If patient is still having symptoms, recommend upper endoscopy as discussed during office visit  Rohini Vanga

## 2023-01-27 NOTE — Telephone Encounter (Signed)
Tried to call patient but mailbox is full  

## 2023-01-27 NOTE — Telephone Encounter (Signed)
Called and got patient schedule for 02/02/2023 with Dr. Marius Ditch. Went over instructions and sent to [employer]

## 2023-02-02 ENCOUNTER — Encounter
Admission: RE | Disposition: A | Discharge status: Home / Self Care | Payer: Self-pay | Source: Home / Self Care | Attending: Gastroenterology

## 2023-02-02 ENCOUNTER — Ambulatory Visit: Payer: Managed Care, Other (non HMO) | Admitting: Certified Registered"

## 2023-02-02 ENCOUNTER — Ambulatory Visit
Admission: RE | Admit: 2023-02-02 | Discharge: 2023-02-02 | Disposition: A | Discharge status: Home / Self Care | Payer: Managed Care, Other (non HMO) | Attending: Gastroenterology | Admitting: Gastroenterology

## 2023-02-02 DIAGNOSIS — K259 Gastric ulcer, unspecified as acute or chronic, without hemorrhage or perforation: Secondary | ICD-10-CM

## 2023-02-02 DIAGNOSIS — R1013 Epigastric pain: Secondary | ICD-10-CM

## 2023-02-02 DIAGNOSIS — K3189 Other diseases of stomach and duodenum: Secondary | ICD-10-CM | POA: Insufficient documentation

## 2023-02-02 DIAGNOSIS — K2960 Other gastritis without bleeding: Secondary | ICD-10-CM | POA: Insufficient documentation

## 2023-02-02 DIAGNOSIS — J45909 Unspecified asthma, uncomplicated: Secondary | ICD-10-CM | POA: Insufficient documentation

## 2023-02-02 HISTORY — PX: ESOPHAGOGASTRODUODENOSCOPY (EGD) WITH PROPOFOL: SHX5813

## 2023-02-02 LAB — POCT PREGNANCY, URINE: Preg Test, Ur: NEGATIVE

## 2023-02-02 SURGERY — ESOPHAGOGASTRODUODENOSCOPY (EGD) WITH PROPOFOL
Anesthesia: General

## 2023-02-02 MED ORDER — DEXMEDETOMIDINE HCL IN NACL 200 MCG/50ML IV SOLN
INTRAVENOUS | Status: DC | PRN
  Administered 2023-02-02: 16 ug via INTRAVENOUS

## 2023-02-02 MED ORDER — PROPOFOL 500 MG/50ML IV EMUL
INTRAVENOUS | Status: DC | PRN
  Administered 2023-02-02: 199 ug/kg/min via INTRAVENOUS

## 2023-02-02 MED ORDER — LIDOCAINE HCL (CARDIAC) PF 100 MG/5ML IV SOSY
PREFILLED_SYRINGE | INTRAVENOUS | Status: DC | PRN
  Administered 2023-02-02: 50 mg via INTRAVENOUS

## 2023-02-02 MED ORDER — MIDAZOLAM HCL 2 MG/2ML IJ SOLN
INTRAMUSCULAR | Status: AC
  Filled 2023-02-02: qty 2

## 2023-02-02 MED ORDER — SODIUM CHLORIDE 0.9 % IV SOLN: INTRAVENOUS | Status: DC

## 2023-02-02 MED ORDER — MIDAZOLAM HCL 2 MG/2ML IJ SOLN
INTRAMUSCULAR | Status: DC | PRN
  Administered 2023-02-02: 2 mg via INTRAVENOUS

## 2023-02-02 MED ORDER — PROPOFOL 10 MG/ML IV BOLUS
INTRAVENOUS | Status: DC | PRN
  Administered 2023-02-02: 70 mg via INTRAVENOUS

## 2023-02-02 NOTE — Anesthesia Preprocedure Evaluation (Signed)
Anesthesia Evaluation  Patient identified by MRN, date of birth, ID band Patient awake    Reviewed: Allergy & Precautions, NPO status , Patient's Chart, lab work & pertinent test results  Airway Mallampati: II  TM Distance: >3 FB Neck ROM: Full    Dental  (+) Teeth Intact   Pulmonary neg pulmonary ROS, asthma    Pulmonary exam normal breath sounds clear to auscultation       Cardiovascular Exercise Tolerance: Good negative cardio ROS Normal cardiovascular exam Rhythm:Regular     Neuro/Psych  Headaches  Anxiety     negative neurological ROS  negative psych ROS   GI/Hepatic negative GI ROS, Neg liver ROS,,,  Endo/Other  negative endocrine ROS    Renal/GU negative Renal ROS  negative genitourinary   Musculoskeletal   Abdominal Normal abdominal exam  (+)   Peds negative pediatric ROS (+)  Hematology negative hematology ROS (+)   Anesthesia Other Findings Past Medical History: No date: Asthma No date: Broken arm No date: Headache No date: Vaccine for human papilloma virus (HPV) types 6, 11, 16, and  18 administered  Past Surgical History: 07/22/2020: LAPAROSCOPY; N/A     Comment:  Procedure: LAPAROSCOPY DIAGNOSTIC;  Surgeon: Malachy Mood, MD;  Location: [facility] ORS;  Service: Gynecology;                Laterality: N/A; 10/2011: OTHER SURGICAL HISTORY; Right     Comment:  Right thumb  BMI    Body Mass Index: 27.73 kg/m      Reproductive/Obstetrics negative OB ROS                             Anesthesia Physical Anesthesia Plan  ASA: 2  Anesthesia Plan: General   Post-op Pain Management:    Induction: Intravenous  PONV Risk Score and Plan: Propofol infusion and TIVA  Airway Management Planned: Natural Airway and Nasal Cannula  Additional Equipment:   Intra-op Plan:   Post-operative Plan:   Informed Consent: I have reviewed the patients History and  Physical, chart, labs and discussed the procedure including the risks, benefits and alternatives for the proposed anesthesia with the patient or authorized representative who has indicated his/her understanding and acceptance.     Dental Advisory Given  Plan Discussed with: CRNA and Surgeon  Anesthesia Plan Comments:        Anesthesia Quick Evaluation

## 2023-02-02 NOTE — Op Note (Signed)
[facility] Gastroenterology Patient Name: Amy [name] Procedure Date: 02/02/2023 10:36 AM MRN: [id number] Account #: [id number] Date of Birth: [birth year] Admit Type: Outpatient Age: 22 Room: [facility] ENDO ROOM 4 Gender: Female Note Status: Finalized Instrument Name: Altamese Cabal Endoscope [id number] Procedure:             Upper GI endoscopy Indications:           Dyspepsia Providers:             [name] MD, MD Referring MD:          Wayland Denis (Referring MD) Medicines:             General Anesthesia Complications:         No immediate complications. Estimated blood loss: None. Procedure:             Pre-Anesthesia Assessment:                        - Prior to the procedure, a History and Physical was                         performed, and patient medications and allergies were                         reviewed. The patient is competent. The risks and                         benefits of the procedure and the sedation options and                         risks were discussed with the patient. All questions                         were answered and informed consent was obtained.                         Patient identification and proposed procedure were                         verified by the physician, the nurse, the                         anesthesiologist, the anesthetist and the technician                         in the pre-procedure area in the procedure room in the                         endoscopy suite. Mental Status Examination: alert and                         oriented. Airway Examination: normal oropharyngeal                         airway and neck mobility. Respiratory Examination:                         clear to auscultation. CV Examination: normal.  Prophylactic Antibiotics: The patient does not require                         prophylactic antibiotics. Prior Anticoagulants: The                         patient has taken no  anticoagulant or antiplatelet                         agents. ASA Grade Assessment: I - A normal, healthy                         patient. After reviewing the risks and benefits, the                         patient was deemed in satisfactory condition to                         undergo the procedure. The anesthesia plan was to use                         general anesthesia. Immediately prior to                         administration of medications, the patient was                         re-assessed for adequacy to receive sedatives. The                         heart rate, respiratory rate, oxygen saturations,                         blood pressure, adequacy of pulmonary ventilation, and                         response to care were monitored throughout the                         procedure. The physical status of the patient was                         re-assessed after the procedure.                        After obtaining informed consent, the endoscope was                         passed under direct vision. Throughout the procedure,                         the patient's blood pressure, pulse, and oxygen                         saturations were monitored continuously. The Endoscope                         was introduced through the mouth, and advanced to the  second part of duodenum. The upper GI endoscopy was                         accomplished without difficulty. The patient tolerated                         the procedure well. Findings:      The duodenal bulb and second portion of the duodenum were normal.       Biopsies were taken with a cold forceps for histology.      Multiple dispersed small erosions with no bleeding and no stigmata of       recent bleeding were found in the gastric fundus and in the gastric       body. Biopsies were taken with a cold forceps for Helicobacter pylori       testing.      The incisura and gastric antrum were normal. Biopsies  were taken with a       cold forceps for Helicobacter pylori testing.      The cardia and gastric fundus were normal on retroflexion.      Esophagogastric landmarks were identified: the gastroesophageal junction       was found at 35 cm from the incisors.      The gastroesophageal junction and examined esophagus were normal. Impression:            - Normal duodenal bulb and second portion of the                         duodenum. Biopsied.                        - Erosive gastropathy with no bleeding and no stigmata                         of recent bleeding. Biopsied.                        - Normal incisura and antrum. Biopsied.                        - Esophagogastric landmarks identified.                        - Normal gastroesophageal junction and esophagus. Recommendation:        - Await pathology results.                        - Discharge patient to home (with escort).                        - Resume previous diet today.                        - Continue present medications. Procedure Code(s):     --- Professional ---                        [phone], Esophagogastroduodenoscopy, flexible,                         transoral; with biopsy, single or multiple Diagnosis Code(s):     ---  Professional ---                        K31.89, Other diseases of stomach and duodenum                        R10.13, Epigastric pain CPT copyright 2022 American Medical Association. All rights reserved. The codes documented in this report are preliminary and upon coder review may  be revised to meet current compliance requirements. Dr. Ulyess Mort [name] MD, MD 02/02/2023 10:57:36 AM This report has been signed electronically. Number of Addenda: 0 Note Initiated On: 02/02/2023 10:36 AM Estimated Blood Loss:  Estimated blood loss: none.      [facility]

## 2023-02-02 NOTE — Transfer of Care (Signed)
Immediate Anesthesia Transfer of Care Note  Patient: Amy [name]  Procedure(s) Performed: ESOPHAGOGASTRODUODENOSCOPY (EGD) WITH PROPOFOL  Patient Location: Endoscopy Unit  Anesthesia Type:MAC  Level of Consciousness: awake  Airway & Oxygen Therapy: Patient Spontanous Breathing and Patient connected to nasal cannula oxygen  Post-op Assessment: Report given to RN and Post -op Vital signs reviewed and stable  Post vital signs: Reviewed and stable  Last Vitals:  Vitals Value Taken Time  BP    Temp    Pulse    Resp    SpO2      Last Pain:  Vitals:   02/02/23 1013  TempSrc: Temporal  PainSc: 0-No pain         Complications: No notable events documented.

## 2023-02-02 NOTE — Anesthesia Postprocedure Evaluation (Signed)
Anesthesia Post Note  Patient: Amy [name]  Procedure(s) Performed: ESOPHAGOGASTRODUODENOSCOPY (EGD) WITH PROPOFOL  Patient location during evaluation: PACU Anesthesia Type: General Level of consciousness: awake and awake and alert Pain management: pain level controlled Vital Signs Assessment: post-procedure vital signs reviewed and stable Respiratory status: spontaneous breathing and nonlabored ventilation Cardiovascular status: stable Anesthetic complications: no   No notable events documented.   Last Vitals:  Vitals:   02/02/23 1100 02/02/23 1109  BP: (!) 101/55 109/61  Pulse: 82 72  Resp: 18 16  Temp: (!) 35.8 C   SpO2: 97% 100%    Last Pain:  Vitals:   02/02/23 1100  TempSrc: Temporal  PainSc: Asleep                 VAN STAVEREN,Conrado Nance

## 2023-02-02 NOTE — H&P (Signed)
Cephas Darby, MD [address]  [place]  [place],  [address]  Main: [phone]  Fax: [phone] Pager: [phone]  Primary Care Physician:  Wayland Denis, PA-C Primary Gastroenterologist:  Dr. Cephas Darby  Pre-Procedure History & Physical: HPI:  JENAN KOVARIK is a 22 y.o. female is here for an endoscopy.   Past Medical History:  Diagnosis Date   Asthma    Broken arm    Headache    Vaccine for human papilloma virus (HPV) types 6, 11, 16, and 18 administered     Past Surgical History:  Procedure Laterality Date   LAPAROSCOPY N/A 07/22/2020   Procedure: LAPAROSCOPY DIAGNOSTIC;  Surgeon: Malachy Mood, MD;  Location: [facility];  Service: Gynecology;  Laterality: N/A;   OTHER SURGICAL HISTORY Right 10/2011   Right thumb    Prior to Admission medications   Medication Sig Start Date End Date Taking? Authorizing Provider  DULoxetine (CYMBALTA) 30 MG capsule Take 30 mg by mouth 2 (two) times daily. 06/28/22  Yes [provider]  montelukast (SINGULAIR) 10 MG tablet Take 10 mg by mouth at bedtime.   Yes [provider]  Rosilyn Mings 113-14 MCG/ACT AEPB Inhale one dose one to two times daily to prevent cough or wheeze.  Rinse, gargle, and spit after use. 01/17/23   Kozlow, Donnamarie Poag, MD  albuterol (VENTOLIN HFA) 108 (90 Base) MCG/ACT inhaler Inhale 2 puffs into the lungs every 6 (six) hours as needed for wheezing or shortness of breath (Cough). 10/17/22   Lynden Oxford Scales, PA-C  Calcium-Phosphorus-Vitamin D (CALCIUM/D3 ADULT GUMMIES) 200-96.6-200 MG-MG-UNIT CHEW Chew 1 tablet by mouth daily.     [provider]  cetirizine (ZYRTEC ALLERGY) 10 MG tablet Take 1 tablet (10 mg total) by mouth at bedtime. 10/17/22 04/15/23  Lynden Oxford Scales, PA-C  EPINEPHrine (AUVI-Q) 0.3 mg/0.3 mL IJ SOAJ injection Inject 0.3 mg into the muscle as needed for anaphylaxis. As directed for life-threatening allergic reactions 07/23/21   Kozlow, Donnamarie Poag, MD  medroxyPROGESTERone Acetate 150 MG/ML SUSY Inject 1 mL (150 mg total) into the muscle once for 1 dose. 11/03/21 [id number]  Copland, Deirdre Evener, PA-C  omeprazole (PRILOSEC) 40 MG capsule TAKE 1 CAPSULE(40 MG) BY MOUTH DAILY Patient not taking: Reported on 01/17/2023 07/21/22   Jiles Prows, MD  promethazine (PHENERGAN) 25 MG tablet Take 1 tablet (25 mg total) by mouth every 6 (six) hours as needed for nausea or vomiting. 10/17/22   Lynden Oxford Scales, PA-C    Allergies as of 01/27/2023 - Review Complete 01/18/2023  Allergen Reaction Noted   Fruit & vegetable daily [nutritional supplements] Itching 07/16/2020   Other  05/14/2014    Family History  Problem Relation Age of Onset   Asthma Mother    Migraines Maternal Grandmother    Depression Maternal Grandmother    Anxiety disorder Maternal Grandmother    Diabetes Maternal Grandmother    Hypertension Maternal Grandmother    Breast cancer Paternal Grandmother        76s   Migraines Maternal Uncle        Had migraines as a teen   ADD / ADHD Maternal Uncle    Breast cancer Other 19   Stomach cancer Other    Colon cancer Other     Social History   Socioeconomic History   Marital status: Single    Spouse name: Not on file   Number of children: Not on file   Years of education: Not on  file   Highest education level: Not on file  Occupational History   Not on file  Tobacco Use   Smoking status: Never   Smokeless tobacco: Never  Vaping Use   Vaping Use: Never used  Substance and Sexual Activity   Alcohol use: Yes    Comment: occasionally   Drug use: No   Sexual activity: Yes    Birth control/protection: I.U.D.    Comment: Kyleena  Other Topics Concern   Not on file  Social History Narrative   Not on file   Social Determinants of Health   Financial Resource Strain: Not on file  Food Insecurity: Not on file  Transportation Needs: Not on file  Physical Activity: Unknown (06/12/2019)   Exercise Vital Sign    Days  of Exercise per Week: 0 days    Minutes of Exercise per Session: Not on file  Stress: Not on file  Social Connections: Not on file  Intimate Partner Violence: Not on file    Review of Systems: See HPI, otherwise negative ROS  Physical Exam: BP 124/82   Pulse 85   Temp (!) 96.7 F (35.9 C) (Temporal)   Resp 16   Ht 5' (1.524 m)   Wt 64.4 kg   SpO2 99%   BMI 27.73 kg/m  General:   Alert,  pleasant and cooperative in NAD Head:  Normocephalic and atraumatic. Neck:  Supple; no masses or thyromegaly. Lungs:  Clear throughout to auscultation.    Heart:  Regular rate and rhythm. Abdomen:  Soft, nontender and nondistended. Normal bowel sounds, without guarding, and without rebound.   Neurologic:  Alert and  oriented x4;  grossly normal neurologically.  Impression/Plan: MELVA BAMBA is here for an endoscopy to be performed for  Chronic nausea, vomiting, upper abdominal pain   Risks, benefits, limitations, and alternatives regarding  endoscopy have been reviewed with the patient.  Questions have been answered.  All parties agreeable.   Sherri Sear, MD  02/02/2023, 10:40 AM

## 2023-02-03 ENCOUNTER — Telehealth: Payer: Self-pay

## 2023-02-03 ENCOUNTER — Telehealth: Payer: Self-pay | Admitting: Allergy and Immunology

## 2023-02-03 ENCOUNTER — Encounter: Payer: Self-pay | Admitting: Gastroenterology

## 2023-02-03 DIAGNOSIS — R1013 Epigastric pain: Secondary | ICD-10-CM

## 2023-02-03 DIAGNOSIS — R112 Nausea with vomiting, unspecified: Secondary | ICD-10-CM

## 2023-02-03 LAB — SURGICAL PATHOLOGY

## 2023-02-03 NOTE — Telephone Encounter (Signed)
Patient states INS is not covering the Airduo so she is requesting an alternative. Patient is wondering if we would be able to fill her Montelukast as well.   [place] [facility] [address]

## 2023-02-03 NOTE — Telephone Encounter (Signed)
-----   Message from [name], MD sent at 02/03/2023  4:52 PM EST ----- Please inform patient that the pathology results from upper endoscopy came back normal.  Recommend HIDA scan to evaluate gallbladder function if patient is agreeable  Amy [name]

## 2023-02-04 MED ORDER — MONTELUKAST SODIUM 10 MG PO TABS: 10.0000 mg | ORAL_TABLET | Freq: Every day | ORAL | 5 refills | Status: DC

## 2023-02-04 NOTE — Telephone Encounter (Signed)
Called and patient verbalized understanding of results she is okay with HIDA scan. Called and got patient schedule [facility] Need to arrive at 7:30am for a 8:00am scan. Nothing to eat or drink after midnight. Called and informed patient of this and she verbalized understanding

## 2023-02-04 NOTE — Telephone Encounter (Signed)
Per insurance formulary Generic Advair should be covered. Please advice.   Montelukast refill has been sent in

## 2023-02-05 ENCOUNTER — Ambulatory Visit
Admission: EM | Admit: 2023-02-05 | Discharge: 2023-02-05 | Disposition: A | Discharge status: Home / Self Care | Payer: Managed Care, Other (non HMO) | Attending: Urgent Care | Admitting: Urgent Care

## 2023-02-05 DIAGNOSIS — B349 Viral infection, unspecified: Secondary | ICD-10-CM | POA: Diagnosis not present

## 2023-02-05 DIAGNOSIS — J4530 Mild persistent asthma, uncomplicated: Secondary | ICD-10-CM | POA: Diagnosis not present

## 2023-02-05 LAB — POCT INFLUENZA A/B
Influenza A, POC: NEGATIVE
Influenza B, POC: NEGATIVE

## 2023-02-05 MED ORDER — PSEUDOEPHEDRINE HCL 60 MG PO TABS: 60.0000 mg | ORAL_TABLET | Freq: Three times a day (TID) | ORAL | 0 refills | Status: DC | PRN

## 2023-02-05 MED ORDER — PROMETHAZINE HCL 25 MG PO TABS: 25.0000 mg | ORAL_TABLET | Freq: Four times a day (QID) | ORAL | 0 refills | Status: DC | PRN

## 2023-02-05 NOTE — ED Provider Notes (Signed)
[facility]  Note:  This document was prepared using Systems analyst and may include unintentional dictation errors.  MRN: [id number] DOB: 2001-01-05  Subjective:   JENIFFER KOSTERS is a 22 y.o. female presenting for 4 day history of acute onset runny and stuffy nose, coughing, nausea with vomiting, chills. Has had some shob, chest pain after coughing, chest tightness. Has not had fever, body aches. No smoking, vaping. Has used DayQuil, cough syrup. Has history of allergic rhinitis and asthma. No smoking.  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  Patient is trying to go back to work.  States that she does not need a note for work.  She would like a refill for promethazine for the nausea and vomiting which she is also using for her cough.  Zofran does not work for her.  No current facility-administered medications for this encounter.  Current Outpatient Medications:    AIRDUO DIGIHALER 113-14 MCG/ACT AEPB, Inhale one dose one to two times daily to prevent cough or wheeze.  Rinse, gargle, and spit after use., Disp: 1 each, Rfl: 5   albuterol (VENTOLIN HFA) 108 (90 Base) MCG/ACT inhaler, Inhale 2 puffs into the lungs every 6 (six) hours as needed for wheezing or shortness of breath (Cough)., Disp: 18 g, Rfl: 0   Calcium-Phosphorus-Vitamin D (CALCIUM/D3 ADULT GUMMIES) 200-96.6-200 MG-MG-UNIT CHEW, Chew 1 tablet by mouth daily. , Disp: , Rfl:    cetirizine (ZYRTEC ALLERGY) 10 MG tablet, Take 1 tablet (10 mg total) by mouth at bedtime., Disp: 90 tablet, Rfl: 1   DULoxetine (CYMBALTA) 30 MG capsule, Take 30 mg by mouth 2 (two) times daily., Disp: , Rfl:    EPINEPHrine (AUVI-Q) 0.3 mg/0.3 mL IJ SOAJ injection, Inject 0.3 mg into the muscle as needed for anaphylaxis. As directed for life-threatening allergic reactions, Disp: 2 each, Rfl: 1   montelukast (SINGULAIR) 10 MG tablet, Take 1 tablet (10 mg total) by mouth at  bedtime., Disp: 30 tablet, Rfl: 5   promethazine (PHENERGAN) 25 MG tablet, Take 1 tablet (25 mg total) by mouth every 6 (six) hours as needed for nausea or vomiting., Disp: 30 tablet, Rfl: 0   medroxyPROGESTERone Acetate 150 MG/ML SUSY, Inject 1 mL (150 mg total) into the muscle once for 1 dose., Disp: 1 mL, Rfl: 0   omeprazole (PRILOSEC) 40 MG capsule, TAKE 1 CAPSULE(40 MG) BY MOUTH DAILY (Patient not taking: Reported on 01/17/2023), Disp: 30 capsule, Rfl: 5   Allergies  Allergen Reactions   Fruit & Vegetable Daily [Nutritional Supplements] Itching    All fruit   Other     Seasonal Allergies    Past Medical History:  Diagnosis Date   Asthma    Broken arm    Headache    Vaccine for human papilloma virus (HPV) types 6, 11, 16, and 18 administered      Past Surgical History:  Procedure Laterality Date   ESOPHAGOGASTRODUODENOSCOPY (EGD) WITH PROPOFOL N/A 02/02/2023   Procedure: ESOPHAGOGASTRODUODENOSCOPY (EGD) WITH PROPOFOL;  Surgeon: [name], MD;  Location: [facility];  Service: Gastroenterology;  Laterality: N/A;   LAPAROSCOPY N/A 07/22/2020   Procedure: LAPAROSCOPY DIAGNOSTIC;  Surgeon: Malachy Mood, MD;  Location: [facility];  Service: Gynecology;  Laterality: N/A;   OTHER SURGICAL HISTORY Right 10/2011   Right thumb   UPPER GI ENDOSCOPY      Family History  Problem Relation Age of Onset   Asthma  Mother    Migraines Maternal Grandmother    Depression Maternal Grandmother    Anxiety disorder Maternal Grandmother    Diabetes Maternal Grandmother    Hypertension Maternal Grandmother    Breast cancer Paternal Grandmother        20s   Migraines Maternal Uncle        Had migraines as a teen   ADD / ADHD Maternal Uncle    Breast cancer Other 40   Stomach cancer Other    Colon cancer Other     Social History   Tobacco Use   Smoking status: Never   Smokeless tobacco: Never  Vaping Use   Vaping Use: Never used  Substance Use Topics   Alcohol use: Yes     Comment: occasionally   Drug use: No    ROS   Objective:   Vitals: BP (!) 101/59 (BP Location: Right Arm)   Pulse 85   Temp 98.6 F (37 C) (Oral)   Resp 19   Ht 5' (1.524 m)   Wt 144 lb (65.3 kg)   SpO2 97%   BMI 28.12 kg/m   Physical Exam Constitutional:      General: She is not in acute distress.    Appearance: Normal appearance. She is well-developed and normal weight. She is not ill-appearing, toxic-appearing or diaphoretic.  HENT:     Head: Normocephalic and atraumatic.     Right Ear: Tympanic membrane, ear canal and external ear normal. No drainage or tenderness. No middle ear effusion. There is no impacted cerumen. Tympanic membrane is not erythematous or bulging.     Left Ear: Tympanic membrane, ear canal and external ear normal. No drainage or tenderness.  No middle ear effusion. There is no impacted cerumen. Tympanic membrane is not erythematous or bulging.     Nose: Nose normal. No congestion or rhinorrhea.     Mouth/Throat:     Mouth: Mucous membranes are moist. No oral lesions.     Pharynx: No pharyngeal swelling, oropharyngeal exudate, posterior oropharyngeal erythema or uvula swelling.     Tonsils: No tonsillar exudate or tonsillar abscesses.  Eyes:     General: No scleral icterus.       Right eye: No discharge.        Left eye: No discharge.     Extraocular Movements: Extraocular movements intact.     Right eye: Normal extraocular motion.     Left eye: Normal extraocular motion.     Conjunctiva/sclera: Conjunctivae normal.  Cardiovascular:     Rate and Rhythm: Normal rate and regular rhythm.     Heart sounds: Normal heart sounds. No murmur heard.    No friction rub. No gallop.  Pulmonary:     Effort: Pulmonary effort is normal. No respiratory distress.     Breath sounds: No stridor. No wheezing, rhonchi or rales.  Chest:     Chest wall: No tenderness.  Musculoskeletal:     Cervical back: Normal range of motion and neck supple.  Lymphadenopathy:      Cervical: No cervical adenopathy.  Skin:    General: Skin is warm and dry.  Neurological:     General: No focal deficit present.     Mental Status: She is alert and oriented to person, place, and time.  Psychiatric:        Mood and Affect: Mood normal.        Behavior: Behavior normal.     Assessment and Plan :   PDMP not reviewed  this encounter.  1. Acute viral syndrome   2. Mild persistent asthma without complication     Patient refused a PCR COVID test. She did one at home and was negative. Deferred imaging given clear cardiopulmonary exam, hemodynamically stable vital signs.  Patient was primarily here for influenza testing.  This was negative. Suspect viral URI, viral syndrome. Physical exam findings reassuring and vital signs stable for discharge. Advised supportive care, offered symptomatic relief. Counseled patient on potential for adverse effects with medications prescribed/recommended today, ER and return-to-clinic precautions discussed, patient verbalized understanding.     Jaynee Eagles, [place] 02/05/23 1439

## 2023-02-05 NOTE — ED Triage Notes (Signed)
X4 days  Pt states that she has some coughing, sob, stuffy nose, nausea, vomiting and chills.

## 2023-02-05 NOTE — Discharge Instructions (Addendum)
We will manage this as a viral illness. For sore throat or cough try using a honey-based tea. Use 3 teaspoons of honey with juice squeezed from half lemon. Place shaved pieces of ginger into 1/2-1 cup of water and warm over stove top. Then mix the ingredients and repeat every 4 hours as needed. Please take ibuprofen 623m every 6 hours with food alternating with OR taken together with Tylenol 5045m650mg every 6 hours for throat pain, fevers, aches and pains. Hydrate very well with at least 2 liters of water. Eat light meals such as soups (chicken and noodles, vegetable, chicken and wild rice).  Do not eat foods that you are allergic to.  Taking an antihistamine like Zyrtec (1033maily) can help against postnasal drainage, sinus congestion which can cause sinus pain, sinus headaches, throat pain, painful swallowing, coughing.  You can take this together with pseudoephedrine (Sudafed) at a dose of 60 mg 3 times a day or twice daily as needed for the same kind of nasal drip, congestion.  Use promethazine for nausea, vomiting and/or cough as needed.

## 2023-02-07 MED ORDER — FLUTICASONE-SALMETEROL 250-50 MCG/ACT IN AEPB: INHALATION_SPRAY | RESPIRATORY_TRACT | 5 refills | Status: DC

## 2023-02-07 NOTE — Telephone Encounter (Signed)
Prescription has been sent in. Unable to reach patient. Voicemail full

## 2023-02-07 NOTE — Addendum Note (Signed)
Addended by: Guy Franco on: 02/07/2023 05:41 PM   Modules accepted: Orders

## 2023-02-09 ENCOUNTER — Telehealth: Payer: Self-pay | Admitting: Gastroenterology

## 2023-02-09 ENCOUNTER — Encounter: Payer: Self-pay | Admitting: Allergy and Immunology

## 2023-02-09 ENCOUNTER — Telehealth: Payer: Self-pay

## 2023-02-09 ENCOUNTER — Other Ambulatory Visit (HOSPITAL_COMMUNITY): Payer: Self-pay

## 2023-02-09 NOTE — Telephone Encounter (Signed)
Pt called to cancel and reschedule her procedure with nuclear Medicine on 02/17/2023. Call-back - [phone].

## 2023-02-09 NOTE — Telephone Encounter (Signed)
PA request received via CMM for Wixela Inhub 250-50MCG/ACT aerosol powder  PA submitted to [facility] and is pending determination.   Key: [id number]

## 2023-02-09 NOTE — Telephone Encounter (Signed)
PA has been DENIED due to:   Per your health plan's criteria, this drug is covered if you meet the following: (1) You have tried or cannot use two of the following: Advair HFA, Breo Ellipta, Symbicort (budesonide/ formoterol

## 2023-02-10 MED ORDER — SYMBICORT 160-4.5 MCG/ACT IN AERO: INHALATION_SPRAY | RESPIRATORY_TRACT | 5 refills | Status: AC

## 2023-02-10 NOTE — Addendum Note (Signed)
Addended by: Zandra Abts on: 02/10/2023 11:49 AM   Modules accepted: Orders

## 2023-02-10 NOTE — Telephone Encounter (Signed)
Please advise 

## 2023-02-10 NOTE — Telephone Encounter (Signed)
Bazine sent to [place] message sent to patient.

## 2023-02-17 ENCOUNTER — Other Ambulatory Visit: Payer: Managed Care, Other (non HMO)

## 2023-02-23 ENCOUNTER — Encounter
Admission: RE | Admit: 2023-02-23 | Discharge: 2023-02-23 | Disposition: A | Discharge status: Home / Self Care | Payer: Managed Care, Other (non HMO) | Source: Ambulatory Visit | Attending: Gastroenterology | Admitting: Gastroenterology

## 2023-02-23 DIAGNOSIS — R1013 Epigastric pain: Secondary | ICD-10-CM | POA: Diagnosis present

## 2023-02-23 DIAGNOSIS — R112 Nausea with vomiting, unspecified: Secondary | ICD-10-CM | POA: Insufficient documentation

## 2023-02-23 MED ORDER — TECHNETIUM TC 99M MEBROFENIN IV KIT
5.0000 | PACK | Freq: Once | INTRAVENOUS | Status: AC | PRN
  Administered 2023-02-23: 5.44 via INTRAVENOUS

## 2023-02-24 ENCOUNTER — Encounter: Payer: Self-pay | Admitting: Gastroenterology

## 2023-02-24 DIAGNOSIS — R1013 Epigastric pain: Secondary | ICD-10-CM

## 2023-02-24 DIAGNOSIS — R112 Nausea with vomiting, unspecified: Secondary | ICD-10-CM

## 2023-03-05 LAB — ALPHA-GAL PANEL
Allergen Lamb IgE: <0.1 kU/L
Beef IgE: <0.1 kU/L
IgE (Immunoglobulin E), Serum: 42 IU/mL (ref 6–495)
O215-IgE Alpha-Gal: <0.1 kU/L
Pork IgE: <0.1 kU/L

## 2023-03-05 LAB — FOOD ALLERGY PROFILE
Allergen Corn, IgE: 0.12 kU/L — AB
Clam IgE: 0.1 kU/L — AB
Codfish IgE: <0.1 kU/L
Egg White IgE: <0.1 kU/L
Milk IgE: <0.1 kU/L
Peanut IgE: 0.19 kU/L — AB
Scallop IgE: <0.1 kU/L
Sesame Seed IgE: 0.15 kU/L — AB
Shrimp IgE: <0.1 kU/L
Soybean IgE: <0.1 kU/L
Walnut IgE: <0.1 kU/L
Wheat IgE: 0.22 kU/L — AB

## 2023-03-07 ENCOUNTER — Encounter: Payer: Self-pay | Admitting: Gastroenterology

## 2023-03-28 ENCOUNTER — Encounter: Payer: Self-pay | Admitting: Gastroenterology

## 2023-03-28 ENCOUNTER — Encounter: Payer: Self-pay | Admitting: Allergy and Immunology

## 2023-04-06 ENCOUNTER — Other Ambulatory Visit: Payer: Self-pay | Admitting: Allergy and Immunology

## 2023-04-06 DIAGNOSIS — J4540 Moderate persistent asthma, uncomplicated: Secondary | ICD-10-CM

## 2023-04-06 DIAGNOSIS — J3089 Other allergic rhinitis: Secondary | ICD-10-CM

## 2023-04-06 DIAGNOSIS — J301 Allergic rhinitis due to pollen: Secondary | ICD-10-CM

## 2023-04-07 DIAGNOSIS — J3089 Other allergic rhinitis: Secondary | ICD-10-CM | POA: Diagnosis not present

## 2023-04-07 NOTE — Progress Notes (Signed)
Aeroallergen Immunotherapy   Ordering Provider: Dr. Laurette Schimke   Patient Details  Name: NANDI TONNESEN  MRN: [id number]  Date of Birth: July 30, 2001   Order one of two   Vial Label: dust mite / weeds   0.3 ml (Volume)  1:20 Concentration -- Ragweed Mix  0.2 ml (Volume)  1:20 Concentration -- Burweed Marshelder  0.2 ml (Volume)  1:10 Concentration -- Plantain English  0.2 ml (Volume)  1:20 Concentration -- Lamb's Quarters*  0.5 ml (Volume)   AU Concentration -- Mite Mix (DF 5,000 & DP 5,000)    1.4  ml Extract Subtotal  3.6  ml Diluent  5.0  ml Maintenance Total   Blue Vial (1:100,000): Schedule B (6 doses)  Yellow Vial (1:10,000): Schedule B (6 doses)  Green Vial (1:1,000): Schedule B (6 doses)  Red Vial (1:100): Schedule A (12 doses)   Special Instructions: 1-2 times per week

## 2023-04-07 NOTE — Progress Notes (Signed)
VIALS EXP 04-10-24 

## 2023-04-07 NOTE — Progress Notes (Signed)
Aeroallergen Immunotherapy   Ordering Provider: Dr. Laurette Schimke   Patient Details  Name: Amy [name]  MRN: [id number]  Date of Birth: 2001/07/18   Order two of two   Vial Label: tree / grass / cat / dog   0.3 ml (Volume)  BAU Concentration -- 7 Grass Mix* 100,000 ([address] [place], [place], [place], Perennial Rye, RedTop, Sweet Vernal, Timothy)  0.1 ml (Volume)  1:20 Concentration -- Johnson  0.5 ml (Volume)  1:20 Concentration -- Eastern 10 Tree Mix (also Sweet Gum)  0.2 ml (Volume)  1:10 Concentration -- Charletta Cousin mix*  0.2 ml (Volume)  1:10 Concentration -- Pecan Pollen  0.2 ml (Volume)  1:20 Concentration -- Walnut, Black Pollen  0.3 ml (Volume)  1:10 Concentration -- Cat Hair  0.3 ml (Volume)  1:10 Concentration -- Dog Epithelia    2.2  ml Extract Subtotal  2.8  ml Diluent  5.0  ml Maintenance Total   Blue Vial (1:100,000): Schedule B (6 doses)  Yellow Vial (1:10,000): Schedule B (6 doses)  Green Vial (1:1,000): Schedule B (6 doses)  Red Vial (1:100): Schedule A (12 doses)   Special Instructions: 1-2 times per week

## 2023-04-08 DIAGNOSIS — J3081 Allergic rhinitis due to animal (cat) (dog) hair and dander: Secondary | ICD-10-CM | POA: Diagnosis not present

## 2023-04-12 ENCOUNTER — Other Ambulatory Visit (HOSPITAL_COMMUNITY): Payer: Self-pay

## 2023-04-12 MED ORDER — CIPROFLOXACIN HCL 500 MG PO TABS
500.0000 mg | ORAL_TABLET | Freq: Once | ORAL | 0 refills | Status: AC
  Filled 2023-04-12: qty 1, 1d supply, fill #0

## 2023-04-27 ENCOUNTER — Other Ambulatory Visit: Payer: Self-pay | Admitting: *Deleted

## 2023-04-27 ENCOUNTER — Ambulatory Visit (INDEPENDENT_AMBULATORY_CARE_PROVIDER_SITE_OTHER): Payer: Managed Care, Other (non HMO) | Admitting: *Deleted

## 2023-04-27 DIAGNOSIS — J309 Allergic rhinitis, unspecified: Secondary | ICD-10-CM | POA: Diagnosis not present

## 2023-04-27 NOTE — Progress Notes (Signed)
Immunotherapy   Patient Details  Name: [name] MRN: [id number] Date of Birth: [birth year]  04/27/2023  Andi Hence started injections for   DMITE/WEED & TREE/GRASS/CAT/DOG Following schedule: B  Frequency:2 times per week Epi-Pen:Epi-Pen Available  Consent signed and patient instructions given.   Redge Gainer 04/27/2023, 1:57 PM

## 2023-05-02 ENCOUNTER — Ambulatory Visit (INDEPENDENT_AMBULATORY_CARE_PROVIDER_SITE_OTHER): Payer: Managed Care, Other (non HMO) | Admitting: *Deleted

## 2023-05-02 DIAGNOSIS — J309 Allergic rhinitis, unspecified: Secondary | ICD-10-CM

## 2023-05-11 ENCOUNTER — Ambulatory Visit (INDEPENDENT_AMBULATORY_CARE_PROVIDER_SITE_OTHER): Payer: Managed Care, Other (non HMO) | Admitting: *Deleted

## 2023-05-11 DIAGNOSIS — J309 Allergic rhinitis, unspecified: Secondary | ICD-10-CM | POA: Diagnosis not present

## 2023-05-23 ENCOUNTER — Ambulatory Visit (INDEPENDENT_AMBULATORY_CARE_PROVIDER_SITE_OTHER): Payer: Managed Care, Other (non HMO) | Admitting: *Deleted

## 2023-05-23 DIAGNOSIS — J309 Allergic rhinitis, unspecified: Secondary | ICD-10-CM | POA: Diagnosis not present

## 2023-05-30 ENCOUNTER — Ambulatory Visit (INDEPENDENT_AMBULATORY_CARE_PROVIDER_SITE_OTHER): Payer: Managed Care, Other (non HMO)

## 2023-05-30 DIAGNOSIS — J309 Allergic rhinitis, unspecified: Secondary | ICD-10-CM

## 2023-06-15 ENCOUNTER — Ambulatory Visit (INDEPENDENT_AMBULATORY_CARE_PROVIDER_SITE_OTHER): Payer: Managed Care, Other (non HMO)

## 2023-06-15 DIAGNOSIS — J309 Allergic rhinitis, unspecified: Secondary | ICD-10-CM

## 2023-06-22 ENCOUNTER — Ambulatory Visit (INDEPENDENT_AMBULATORY_CARE_PROVIDER_SITE_OTHER): Payer: Managed Care, Other (non HMO) | Admitting: *Deleted

## 2023-06-22 DIAGNOSIS — J309 Allergic rhinitis, unspecified: Secondary | ICD-10-CM | POA: Diagnosis not present

## 2023-06-30 ENCOUNTER — Ambulatory Visit (INDEPENDENT_AMBULATORY_CARE_PROVIDER_SITE_OTHER): Payer: Managed Care, Other (non HMO) | Admitting: *Deleted

## 2023-06-30 DIAGNOSIS — J309 Allergic rhinitis, unspecified: Secondary | ICD-10-CM

## 2023-07-07 ENCOUNTER — Ambulatory Visit (INDEPENDENT_AMBULATORY_CARE_PROVIDER_SITE_OTHER): Payer: Managed Care, Other (non HMO) | Admitting: *Deleted

## 2023-07-07 DIAGNOSIS — J309 Allergic rhinitis, unspecified: Secondary | ICD-10-CM

## 2023-07-18 ENCOUNTER — Ambulatory Visit: Payer: Self-pay | Admitting: *Deleted

## 2023-07-18 ENCOUNTER — Ambulatory Visit (INDEPENDENT_AMBULATORY_CARE_PROVIDER_SITE_OTHER): Payer: Managed Care, Other (non HMO) | Admitting: Allergy and Immunology

## 2023-07-18 ENCOUNTER — Encounter: Payer: Self-pay | Admitting: Allergy and Immunology

## 2023-07-18 VITALS — BP 106/62 | HR 85 | Temp 97.9°F | Resp 16 | Ht 61.0 in | Wt 135.0 lb

## 2023-07-18 DIAGNOSIS — T781XXD Other adverse food reactions, not elsewhere classified, subsequent encounter: Secondary | ICD-10-CM

## 2023-07-18 DIAGNOSIS — J301 Allergic rhinitis due to pollen: Secondary | ICD-10-CM | POA: Diagnosis not present

## 2023-07-18 DIAGNOSIS — J3089 Other allergic rhinitis: Secondary | ICD-10-CM | POA: Diagnosis not present

## 2023-07-18 DIAGNOSIS — J4540 Moderate persistent asthma, uncomplicated: Secondary | ICD-10-CM

## 2023-07-18 DIAGNOSIS — J309 Allergic rhinitis, unspecified: Secondary | ICD-10-CM

## 2023-07-18 DIAGNOSIS — K219 Gastro-esophageal reflux disease without esophagitis: Secondary | ICD-10-CM

## 2023-07-18 MED ORDER — AIRSUPRA 90-80 MCG/ACT IN AERO: 2.0000 | INHALATION_SPRAY | RESPIRATORY_TRACT | 3 refills | Status: DC | PRN

## 2023-07-18 MED ORDER — OMEPRAZOLE 40 MG PO CPDR: 40.0000 mg | DELAYED_RELEASE_CAPSULE | Freq: Two times a day (BID) | ORAL | 5 refills | Status: DC

## 2023-07-18 NOTE — Progress Notes (Unsigned)
[place] - [place] - [place] - [place] - [place]   Follow-up Note  Referring Provider: Carren Rang, PA-C Primary Provider: Carren Rang, PA-C Date of Office Visit: 07/18/2023  Subjective:   Amy [name] (DOB: 2001-06-16) is a 22 y.o. female who [facility] in re-evaluation of the following:  HPI: [name] returns to this clinic in evaluation of asthma, allergic rhinitis, oral allergy syndrome, anosmia, LPR.  I last saw her in this clinic 17 January 2023.  Her respiratory tract has really done very well and she has not required a systemic steroid or an antibiotic for any type of airway issue.  Because of an insurance issue she had to change her air duo to Symbicort and she is using this mostly 1 time per day and rarely uses any short acting bronchodilator.  Her nose is doing very well and she believes that she can now smell food if it is near close to her nose.  She uses a nasal steroid about once a week and continues on montelukast.  She still remains away from certain foods that give rise to oral reactions.  She definitely does not need any pistachios or sushi or foods that bothers her mouth as well.  She is still having some nausea.  She had an upper endoscopy in February and a HIDA scan in March apparently both of those were normal.  She continues on omeprazole once a day. It should be noted that she drinks extensive amounts of caffeine including 3 sodas and 3 coffees on workdays.  She drinks alcohol about once a week.  Allergies as of 07/18/2023       Reactions   Fruit & Vegetable Daily [nutritional Supplements] Itching   All fruit   Other    Seasonal Allergies        Medication List    albuterol 108 (90 Base) MCG/ACT inhaler Commonly known as: VENTOLIN HFA Inhale 2 puffs into the lungs every 6 (six) hours as needed for wheezing or shortness of breath (Cough).   Calcium/D3 Adult Gummies 200-96.6-200  MG-MG-UNIT Chew Generic drug: Calcium-Phosphorus-Vitamin D Chew 1 tablet by mouth daily.   cetirizine 10 MG tablet Commonly known as: ZyrTEC Allergy Take 1 tablet (10 mg total) by mouth at bedtime.   DULoxetine 60 MG capsule Commonly known as: CYMBALTA Take 60 mg by mouth daily.   EPINEPHrine 0.3 mg/0.3 mL Soaj injection Commonly known as: Auvi-Q Inject 0.3 mg into the muscle as needed for anaphylaxis. As directed for life-threatening allergic reactions   medroxyPROGESTERone Acetate 150 MG/ML Susy Inject 1 mL (150 mg total) into the muscle once for 1 dose.   montelukast 10 MG tablet Commonly known as: SINGULAIR Take 1 tablet (10 mg total) by mouth at bedtime.   omeprazole 40 MG capsule Commonly known as: PRILOSEC TAKE 1 CAPSULE(40 MG) BY MOUTH DAILY   ondansetron 4 MG disintegrating tablet Commonly known as: ZOFRAN-ODT Take 1 tablet by mouth every 8 (eight) hours as needed.   promethazine 25 MG tablet Commonly known as: PHENERGAN Take 1 tablet (25 mg total) by mouth every 6 (six) hours as needed for nausea or vomiting.   Symbicort 160-4.5 MCG/ACT inhaler Generic drug: budesonide-formoterol Inhale two puffs one to two times daily to prevent cough or wheeze.  Rinse, gargle, and spit after use.    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, 11, 16, and 18 administered     Past Surgical History:  Procedure Laterality Date   ESOPHAGOGASTRODUODENOSCOPY (EGD) WITH PROPOFOL N/A 02/02/2023   Procedure: ESOPHAGOGASTRODUODENOSCOPY (EGD) WITH PROPOFOL;  Surgeon: Toney Reil, MD;  Location: [facility] ENDOSCOPY;  Service: Gastroenterology;  Laterality: N/A;   LAPAROSCOPY N/A 07/22/2020   Procedure: LAPAROSCOPY DIAGNOSTIC;  Surgeon: Vena Austria, MD;  Location: [facility] ORS;  Service: Gynecology;  Laterality: N/A;   OTHER SURGICAL HISTORY Right 10/2011   Right thumb   UPPER GI ENDOSCOPY   01/2023    Review of systems negative except as noted in HPI / PMHx or noted below:  Review of Systems  Constitutional: Negative.   HENT: Negative.    Eyes: Negative.   Respiratory: Negative.    Cardiovascular: Negative.   Gastrointestinal: Negative.   Genitourinary: Negative.   Musculoskeletal: Negative.   Skin: Negative.   Neurological: Negative.   Endo/Heme/Allergies: Negative.   Psychiatric/Behavioral: Negative.       Objective:   Vitals:   07/18/23 1043  BP: 106/62  Pulse: 85  Resp: 16  Temp: 97.9 F (36.6 C)  SpO2: 98%   Height: 5\' 1"  (154.9 cm)  Weight: 135 lb (61.2 kg)   Physical Exam Constitutional:      Appearance: She is not diaphoretic.  HENT:     Head: Normocephalic.     Right Ear: Tympanic membrane, ear canal and external ear normal.     Left Ear: Tympanic membrane, ear canal and external ear normal.     Nose: Nose normal. No mucosal edema or rhinorrhea.     Mouth/Throat:     Pharynx: Uvula midline. No oropharyngeal exudate.  Eyes:     Conjunctiva/sclera: Conjunctivae normal.  Neck:     Thyroid: No thyromegaly.     Trachea: Trachea normal. No tracheal tenderness or tracheal deviation.  Cardiovascular:     Rate and Rhythm: Normal rate and regular rhythm.     Heart sounds: Normal heart sounds, S1 normal and S2 normal. No murmur heard. Pulmonary:     Effort: No respiratory distress.     Breath sounds: Normal breath sounds. No stridor. No wheezing or rales.  Lymphadenopathy:     Head:     Right side of head: No tonsillar adenopathy.     Left side of head: No tonsillar adenopathy.     Cervical: No cervical adenopathy.  Skin:    Findings: No erythema or rash.     Nails: There is no clubbing.  Neurological:     Mental Status: She is alert.     Diagnostics: none  Assessment and Plan:   1. Asthma, moderate persistent, well-controlled   2. Perennial allergic rhinitis   3. Seasonal allergic rhinitis due to pollen   4. Pollen-food allergy,  subsequent encounter   5. LPRD (laryngopharyngeal reflux disease)    1.  Allergen avoidance measures -dust mite, cat, dog, other mammals, pollens, foods that give rise to oral reaction  2.  Continue to treat and prevent inflammation:  A. Symbicort 160 - 2 inhalation 1-2 times per day  B. OTC Nasacort - 1 spray each nostril 1-7 times per week C. Montelukast 10 mg - 1 tablet 1 time per day D. Immunotherapy  3.  Continue to treat and prevent reflux/LPR/chest pain:  A. Minimize caffeine consumption B. Increase omeprazole 40 mg - 2 times per day  4.  If needed:  A. Airsupra - 2 inhalations every 4-6 hours (Coupon) B. Cetirizine  10 mg - 1 tablet 1-2 times per day C. Pataday - 1 drop each eye 1 time per day D. Auvi-Q 0.3, benadryl, MD/ER evaluation for allergic reaction  5.  Plan for fall flu vaccine  6.  Return to clinic in 6 months or earlier if problem  Shannie appears to be doing pretty well regarding her atopic respiratory disease on her current plan and she will continue on anti-inflammatory agents for her airway and immunotherapy.  She still has nausea and other issues tied up with LPR and we will have her increase her omeprazole to twice a day and minimize her extensive caffeine consumption.  I have given her an anti-inflammatory rescue medicine to use should it be required.  I will see her back in this clinic in 6 months or earlier if there is a problem.  Laurette Schimke, MD Allergy / Immunology [facility]

## 2023-07-18 NOTE — Patient Instructions (Addendum)
  1.  Allergen avoidance measures -dust mite, cat, dog, other mammals, pollens, foods that give rise to oral reaction  2.  Continue to treat and prevent inflammation:  A. Symbicort 160 - 2 inhalation 1-2 times per day  B. OTC Nasacort - 1 spray each nostril 1-7 times per week C. Montelukast 10 mg - 1 tablet 1 time per day D. Immunotherapy  3.  Continue to treat and prevent reflux/LPR/chest pain:  A. Minimize caffeine consumption B. Increase omeprazole 40 mg - 2 times per day  4.  If needed:  A. Airsupra - 2 inhalations every 4-6 hours (Coupon) B. Cetirizine 10 mg - 1 tablet 1-2 times per day C. Pataday - 1 drop each eye 1 time per day D. Auvi-Q 0.3, benadryl, MD/ER evaluation for allergic reaction  5.  Plan for fall flu vaccine  6.  Return to clinic in 6 months or earlier if problem

## 2023-07-19 ENCOUNTER — Encounter: Payer: Self-pay | Admitting: Allergy and Immunology

## 2023-07-28 ENCOUNTER — Ambulatory Visit (INDEPENDENT_AMBULATORY_CARE_PROVIDER_SITE_OTHER): Payer: Managed Care, Other (non HMO) | Admitting: *Deleted

## 2023-07-28 DIAGNOSIS — J309 Allergic rhinitis, unspecified: Secondary | ICD-10-CM | POA: Diagnosis not present

## 2023-08-18 ENCOUNTER — Ambulatory Visit (INDEPENDENT_AMBULATORY_CARE_PROVIDER_SITE_OTHER): Payer: Managed Care, Other (non HMO) | Admitting: *Deleted

## 2023-08-18 DIAGNOSIS — J309 Allergic rhinitis, unspecified: Secondary | ICD-10-CM

## 2023-08-24 ENCOUNTER — Encounter: Payer: Self-pay | Admitting: Gastroenterology

## 2023-08-25 ENCOUNTER — Ambulatory Visit (INDEPENDENT_AMBULATORY_CARE_PROVIDER_SITE_OTHER): Payer: Managed Care, Other (non HMO)

## 2023-08-25 DIAGNOSIS — J309 Allergic rhinitis, unspecified: Secondary | ICD-10-CM

## 2023-09-01 ENCOUNTER — Ambulatory Visit (INDEPENDENT_AMBULATORY_CARE_PROVIDER_SITE_OTHER): Payer: Managed Care, Other (non HMO) | Admitting: *Deleted

## 2023-09-01 DIAGNOSIS — J309 Allergic rhinitis, unspecified: Secondary | ICD-10-CM

## 2023-09-08 ENCOUNTER — Other Ambulatory Visit (HOSPITAL_BASED_OUTPATIENT_CLINIC_OR_DEPARTMENT_OTHER): Payer: Self-pay

## 2023-09-08 MED ORDER — INFLUENZA VIRUS VACC SPLIT PF (FLUZONE) 0.5 ML IM SUSY
0.5000 mL | PREFILLED_SYRINGE | Freq: Once | INTRAMUSCULAR | 0 refills | Status: AC
  Filled 2023-09-08: qty 0.5, 1d supply, fill #0

## 2023-09-29 ENCOUNTER — Ambulatory Visit (INDEPENDENT_AMBULATORY_CARE_PROVIDER_SITE_OTHER): Payer: Managed Care, Other (non HMO)

## 2023-09-29 DIAGNOSIS — J309 Allergic rhinitis, unspecified: Secondary | ICD-10-CM

## 2023-10-03 ENCOUNTER — Ambulatory Visit (INDEPENDENT_AMBULATORY_CARE_PROVIDER_SITE_OTHER): Payer: Self-pay | Admitting: *Deleted

## 2023-10-03 DIAGNOSIS — J309 Allergic rhinitis, unspecified: Secondary | ICD-10-CM | POA: Diagnosis not present

## 2023-10-11 ENCOUNTER — Other Ambulatory Visit: Payer: Self-pay

## 2023-10-11 ENCOUNTER — Encounter (HOSPITAL_COMMUNITY): Payer: Self-pay | Admitting: Emergency Medicine

## 2023-10-11 ENCOUNTER — Emergency Department (HOSPITAL_COMMUNITY)
Admission: EM | Admit: 2023-10-11 | Discharge: 2023-10-11 | Disposition: A | Discharge status: Home / Self Care | Payer: Managed Care, Other (non HMO) | Attending: Emergency Medicine | Admitting: Emergency Medicine

## 2023-10-11 DIAGNOSIS — R1030 Lower abdominal pain, unspecified: Secondary | ICD-10-CM | POA: Insufficient documentation

## 2023-10-11 DIAGNOSIS — R110 Nausea: Secondary | ICD-10-CM | POA: Insufficient documentation

## 2023-10-11 DIAGNOSIS — R1084 Generalized abdominal pain: Secondary | ICD-10-CM | POA: Diagnosis present

## 2023-10-11 LAB — COMPREHENSIVE METABOLIC PANEL
ALT: 20 U/L (ref 0–44)
AST: 18 U/L (ref 15–41)
Albumin: 4.3 g/dL (ref 3.5–5.0)
Alkaline Phosphatase: 56 U/L (ref 38–126)
Anion gap: 9 (ref 5–15)
BUN: 10 mg/dL (ref 6–20)
CO2: 25 mmol/L (ref 22–32)
Calcium: 9.3 mg/dL (ref 8.9–10.3)
Chloride: 104 mmol/L (ref 98–111)
Creatinine, Ser: 0.75 mg/dL (ref 0.44–1.00)
GFR, Estimated: >60 mL/min (ref >60)
Glucose, Bld: 84 mg/dL (ref 70–99)
Potassium: 3.7 mmol/L (ref 3.5–5.1)
Sodium: 138 mmol/L (ref 135–145)
Total Bilirubin: 0.8 mg/dL (ref 0.3–1.2)
Total Protein: 7.2 g/dL (ref 6.5–8.1)

## 2023-10-11 LAB — URINALYSIS, ROUTINE W REFLEX MICROSCOPIC
Bilirubin Urine: NEGATIVE
Glucose, UA: NEGATIVE mg/dL
Hgb urine dipstick: NEGATIVE
Ketones, ur: NEGATIVE mg/dL
Leukocytes,Ua: NEGATIVE
Nitrite: NEGATIVE
Protein, ur: NEGATIVE mg/dL
Specific Gravity, Urine: 1.023 (ref 1.005–1.030)
pH: 5 (ref 5.0–8.0)

## 2023-10-11 LAB — CBC
HCT: 39.8 % (ref 36.0–46.0)
Hemoglobin: 13.2 g/dL (ref 12.0–15.0)
MCH: 28.8 pg (ref 26.0–34.0)
MCHC: 33.2 g/dL (ref 30.0–36.0)
MCV: 86.7 fL (ref 80.0–100.0)
Platelets: 240 10*3/uL (ref 150–400)
RBC: 4.59 MIL/uL (ref 3.87–5.11)
RDW: 12.1 % (ref 11.5–15.5)
WBC: 7.6 10*3/uL (ref 4.0–10.5)
nRBC: 0 % (ref 0.0–0.2)

## 2023-10-11 LAB — HCG, SERUM, QUALITATIVE: Preg, Serum: NEGATIVE

## 2023-10-11 LAB — LIPASE, BLOOD: Lipase: 29 U/L (ref 11–51)

## 2023-10-11 MED ORDER — PANTOPRAZOLE SODIUM 40 MG IV SOLR
40.0000 mg | Freq: Once | INTRAVENOUS | Status: AC
  Administered 2023-10-11: 40 mg via INTRAVENOUS
  Filled 2023-10-11: qty 10

## 2023-10-11 MED ORDER — SODIUM CHLORIDE 0.9 % IV BOLUS
1000.0000 mL | Freq: Once | INTRAVENOUS | Status: AC
  Administered 2023-10-11: 1000 mL via INTRAVENOUS

## 2023-10-11 MED ORDER — METOCLOPRAMIDE HCL 5 MG/ML IJ SOLN
10.0000 mg | Freq: Once | INTRAMUSCULAR | Status: AC
  Administered 2023-10-11: 10 mg via INTRAVENOUS
  Filled 2023-10-11: qty 2

## 2023-10-11 MED ORDER — ONDANSETRON HCL 4 MG/2ML IJ SOLN
4.0000 mg | Freq: Once | INTRAMUSCULAR | Status: AC
  Administered 2023-10-11: 4 mg via INTRAVENOUS
  Filled 2023-10-11: qty 2

## 2023-10-11 MED ORDER — PROMETHAZINE HCL 25 MG PO TABS: 25.0000 mg | ORAL_TABLET | Freq: Four times a day (QID) | ORAL | 0 refills | Status: DC | PRN

## 2023-10-11 NOTE — Discharge Instructions (Signed)
Follow-up with your family doctor or your GI doctor next week.  Return if any problem

## 2023-10-11 NOTE — ED Provider Triage Note (Signed)
Emergency Medicine Provider Triage Evaluation Note  Amy [name] , a 22 y.o. female  was evaluated in triage.  Pt complains of generalized abdominal pain with nausea and vomiting, onset Saturday. Last emesis around 1530 today..  Review of Systems  Positive: Abdominal pain, nausea, vomiting Negative: Fever, chills, cough, diarrhea  Physical Exam  BP (!) 149/79 (BP Location: Right Arm)   Pulse 83   Temp 98.8 F (37.1 C) (Oral)   Resp 18   Ht 5\' 1"  (1.549 m)   Wt 59.9 kg   SpO2 98%   BMI 24.94 kg/m  Gen:   Awake, no distress   Resp:  Normal effort  MSK:   Moves extremities without difficulty  Other:  Abdomen soft, no focal tenderness  Medical Decision Making  Medically screening exam initiated at 5:49 PM.  Appropriate orders placed.  Amy [name] was informed that the remainder of the evaluation will be completed by another provider, this initial triage assessment does not replace that evaluation, and the importance of remaining in the ED until their evaluation is complete.     Felicie Morn, NP 10/11/23 1750

## 2023-10-11 NOTE — ED Triage Notes (Signed)
Patient arrives ambulatory by POV c/o generalized abdominal pain and emesis since Saturday. Took Zofran earlier this afternoon w/o relief.

## 2023-10-12 ENCOUNTER — Other Ambulatory Visit: Payer: Self-pay | Admitting: *Deleted

## 2023-10-12 MED ORDER — OMEPRAZOLE 40 MG PO CPDR: 40.0000 mg | DELAYED_RELEASE_CAPSULE | Freq: Two times a day (BID) | ORAL | 3 refills | Status: DC

## 2023-10-12 NOTE — ED Provider Notes (Signed)
[place] EMERGENCY DEPARTMENT AT [facility] Provider Note   CSN: [id number] Arrival date & time: 10/11/23  1609     History  Chief Complaint  Patient presents with   Abdominal Pain    Amy [name] is a 22 y.o. female.  Patient has a history of chronic abdominal discomfort with previous GI physician.  Unknown cause for this abdominal discomforts.  Patient also complains of nausea  The history is provided by the patient and medical records. No language interpreter was used.  Abdominal Pain Pain location:  Generalized Pain quality: aching   Pain radiates to:  Does not radiate Pain severity:  Mild Onset quality:  Sudden Timing:  Intermittent Progression:  Waxing and waning Chronicity:  Recurrent Context: not alcohol use   Associated symptoms: no chest pain, no cough, no diarrhea, no fatigue and no hematuria        Home Medications Prior to Admission medications   Medication Sig Start Date End Date Taking? Authorizing Provider  promethazine (PHENERGAN) 25 MG tablet Take 1 tablet (25 mg total) by mouth every 6 (six) hours as needed for nausea or vomiting. 10/11/23  Yes Bethann Berkshire, MD  albuterol (VENTOLIN HFA) 108 (90 Base) MCG/ACT inhaler Inhale 2 puffs into the lungs every 6 (six) hours as needed for wheezing or shortness of breath (Cough). 10/17/22   Theadora Rama Scales, PA-C  Albuterol-Budesonide (AIRSUPRA) 90-80 MCG/ACT AERO Inhale 2 puffs into the lungs every 4 (four) hours as needed. 07/18/23   Kozlow, Alvira Philips, MD  Calcium-Phosphorus-Vitamin D (CALCIUM/D3 ADULT GUMMIES) 200-96.6-200 MG-MG-UNIT CHEW Chew 1 tablet by mouth daily.     [provider]  cetirizine (ZYRTEC ALLERGY) 10 MG tablet Take 1 tablet (10 mg total) by mouth at bedtime. 10/17/22 04/15/23  Theadora Rama Scales, PA-C  DULoxetine (CYMBALTA) 60 MG capsule Take 60 mg by mouth daily.    [provider]  EPINEPHrine (AUVI-Q) 0.3 mg/0.3 mL IJ SOAJ injection Inject 0.3 mg  into the muscle as needed for anaphylaxis. As directed for life-threatening allergic reactions 07/23/21   Kozlow, Alvira Philips, MD  medroxyPROGESTERone Acetate 150 MG/ML SUSY Inject 1 mL (150 mg total) into the muscle once for 1 dose. 11/03/21 01/17/23  Copland, Ilona Sorrel, PA-C  montelukast (SINGULAIR) 10 MG tablet Take 1 tablet (10 mg total) by mouth at bedtime. 02/04/23   Kozlow, Alvira Philips, MD  omeprazole (PRILOSEC) 40 MG capsule Take 1 capsule (40 mg total) by mouth in the morning and at bedtime. 10/12/23   Kozlow, Alvira Philips, MD  ondansetron (ZOFRAN-ODT) 4 MG disintegrating tablet Take 1 tablet by mouth every 8 (eight) hours as needed. 07/13/23   [provider]  SYMBICORT 160-4.5 MCG/ACT inhaler Inhale two puffs one to two times daily to prevent cough or wheeze.  Rinse, gargle, and spit after use. 02/10/23   Kozlow, Alvira Philips, MD      Allergies    Fruit & vegetable daily [nutritional supplements] and Other    Review of Systems   Review of Systems  Constitutional:  Negative for appetite change and fatigue.  HENT:  Negative for congestion, ear discharge and sinus pressure.   Eyes:  Negative for discharge.  Respiratory:  Negative for cough.   Cardiovascular:  Negative for chest pain.  Gastrointestinal:  Positive for abdominal pain. Negative for diarrhea.  Genitourinary:  Negative for frequency and hematuria.  Musculoskeletal:  Negative for back pain.  Skin:  Negative for rash.  Neurological:  Negative for seizures and headaches.  Psychiatric/Behavioral:  Negative for hallucinations.     Physical Exam Updated Vital Signs BP 114/67   Pulse 81   Temp 98.4 F (36.9 C) (Oral)   Resp 18   Ht 5\' 1"  (1.549 m)   Wt 59.9 kg   SpO2 100%   BMI 24.94 kg/m  Physical Exam Vitals and nursing note reviewed.  Constitutional:      Appearance: She is well-developed.  HENT:     Head: Normocephalic.     Nose: Nose normal.  Eyes:     General: No scleral icterus.    Conjunctiva/sclera: Conjunctivae  normal.  Neck:     Thyroid: No thyromegaly.  Cardiovascular:     Rate and Rhythm: Normal rate and regular rhythm.     Heart sounds: No murmur heard.    No friction rub. No gallop.  Pulmonary:     Breath sounds: No stridor. No wheezing or rales.  Chest:     Chest wall: No tenderness.  Abdominal:     General: There is no distension.     Tenderness: There is abdominal tenderness. There is no rebound.  Musculoskeletal:        General: Normal range of motion.     Cervical back: Neck supple.  Lymphadenopathy:     Cervical: No cervical adenopathy.  Skin:    Findings: No erythema or rash.  Neurological:     Mental Status: She is alert and oriented to person, place, and time.     Motor: No abnormal muscle tone.     Coordination: Coordination normal.  Psychiatric:        Behavior: Behavior normal.     ED Results / Procedures / Treatments   Labs (all labs ordered are listed, but only abnormal results are displayed) Labs Reviewed  LIPASE, BLOOD  COMPREHENSIVE METABOLIC PANEL  CBC  URINALYSIS, ROUTINE W REFLEX MICROSCOPIC  HCG, SERUM, QUALITATIVE    EKG None  Radiology No results found.  Procedures Procedures    Medications Ordered in ED Medications  sodium chloride 0.9 % bolus 1,000 mL (0 mLs Intravenous Stopped 10/11/23 2101)  pantoprazole (PROTONIX) injection 40 mg (40 mg Intravenous Given 10/11/23 1923)  ondansetron (ZOFRAN) injection 4 mg (4 mg Intravenous Given 10/11/23 1922)  metoCLOPramide (REGLAN) injection 10 mg (10 mg Intravenous Given 10/11/23 2025)    ED Course/ Medical Decision Making/ A&P                                 Medical Decision Making Amount and/or Complexity of Data Reviewed Labs: ordered.  Risk Prescription drug management.   Patient with exacerbation of her chronic abdominal pain and nausea.  Labs unremarkable.  Patient sent home with Phenergan because this is the nausea medicine that works best for her.  She will follow back up  with her primary care or her GI doctor        Final Clinical Impression(s) / ED Diagnoses Final diagnoses:  Lower abdominal pain    Rx / DC Orders ED Discharge Orders          Ordered    promethazine (PHENERGAN) 25 MG tablet  Every 6 hours PRN        10/11/23 2052              Bethann Berkshire, MD 10/12/23 1252

## 2023-10-13 ENCOUNTER — Ambulatory Visit (INDEPENDENT_AMBULATORY_CARE_PROVIDER_SITE_OTHER): Payer: Managed Care, Other (non HMO) | Admitting: *Deleted

## 2023-10-13 ENCOUNTER — Telehealth: Payer: Self-pay

## 2023-10-13 ENCOUNTER — Other Ambulatory Visit (HOSPITAL_COMMUNITY): Payer: Self-pay

## 2023-10-13 DIAGNOSIS — J309 Allergic rhinitis, unspecified: Secondary | ICD-10-CM | POA: Diagnosis not present

## 2023-10-13 NOTE — Telephone Encounter (Signed)
*  Asthma/Allergy  Pharmacy Patient Advocate Encounter   Received notification from CoverMyMeds that prior authorization for Omeprazole 40MG  dr capsules  is required/requested.   Insurance verification completed.   The patient is insured through [facility] .   Per test claim: PA required; PA submitted to [facility] via CoverMyMeds Key/confirmation #/EOC [id number] Status is pending

## 2023-10-13 NOTE — Telephone Encounter (Signed)
Pharmacy Patient Advocate Encounter  Received notification from [facility] that Prior Authorization for Omeprazole 40MG  dr capsules  has been APPROVED from 10/13/2023 to 10/12/2024. Ran test claim, Copay is $no insurance card on file to complete test claim. This test claim was processed through [facility]- copay amounts may vary at other pharmacies due to pharmacy/plan contracts, or as the patient moves through the different stages of their insurance plan.

## 2023-10-19 ENCOUNTER — Ambulatory Visit (INDEPENDENT_AMBULATORY_CARE_PROVIDER_SITE_OTHER): Payer: Managed Care, Other (non HMO) | Admitting: *Deleted

## 2023-10-19 DIAGNOSIS — J309 Allergic rhinitis, unspecified: Secondary | ICD-10-CM | POA: Diagnosis not present

## 2023-10-27 ENCOUNTER — Ambulatory Visit (INDEPENDENT_AMBULATORY_CARE_PROVIDER_SITE_OTHER): Payer: Managed Care, Other (non HMO) | Admitting: *Deleted

## 2023-10-27 DIAGNOSIS — J309 Allergic rhinitis, unspecified: Secondary | ICD-10-CM | POA: Diagnosis not present

## 2023-11-10 ENCOUNTER — Ambulatory Visit (INDEPENDENT_AMBULATORY_CARE_PROVIDER_SITE_OTHER): Payer: Managed Care, Other (non HMO) | Admitting: *Deleted

## 2023-11-10 DIAGNOSIS — J309 Allergic rhinitis, unspecified: Secondary | ICD-10-CM

## 2023-11-16 ENCOUNTER — Ambulatory Visit (INDEPENDENT_AMBULATORY_CARE_PROVIDER_SITE_OTHER): Payer: Managed Care, Other (non HMO) | Admitting: *Deleted

## 2023-11-16 DIAGNOSIS — J309 Allergic rhinitis, unspecified: Secondary | ICD-10-CM | POA: Diagnosis not present

## 2023-11-24 ENCOUNTER — Ambulatory Visit (INDEPENDENT_AMBULATORY_CARE_PROVIDER_SITE_OTHER): Payer: Self-pay | Admitting: *Deleted

## 2023-11-24 DIAGNOSIS — J309 Allergic rhinitis, unspecified: Secondary | ICD-10-CM

## 2023-11-30 ENCOUNTER — Ambulatory Visit (INDEPENDENT_AMBULATORY_CARE_PROVIDER_SITE_OTHER): Payer: Self-pay | Admitting: *Deleted

## 2023-11-30 DIAGNOSIS — J309 Allergic rhinitis, unspecified: Secondary | ICD-10-CM | POA: Diagnosis not present

## 2023-12-06 ENCOUNTER — Ambulatory Visit (INDEPENDENT_AMBULATORY_CARE_PROVIDER_SITE_OTHER): Payer: Self-pay

## 2023-12-06 DIAGNOSIS — J309 Allergic rhinitis, unspecified: Secondary | ICD-10-CM | POA: Diagnosis not present

## 2023-12-09 ENCOUNTER — Encounter: Payer: Self-pay | Admitting: Gastroenterology

## 2023-12-12 ENCOUNTER — Ambulatory Visit (INDEPENDENT_AMBULATORY_CARE_PROVIDER_SITE_OTHER): Payer: Self-pay | Admitting: *Deleted

## 2023-12-12 DIAGNOSIS — J309 Allergic rhinitis, unspecified: Secondary | ICD-10-CM | POA: Diagnosis not present

## 2023-12-23 ENCOUNTER — Ambulatory Visit
Admission: RE | Admit: 2023-12-23 | Discharge: 2023-12-23 | Disposition: A | Discharge status: Home / Self Care | Payer: Managed Care, Other (non HMO) | Source: Ambulatory Visit

## 2023-12-23 VITALS — BP 128/87 | HR 113 | Temp 98.7°F | Resp 18

## 2023-12-23 DIAGNOSIS — B084 Enteroviral vesicular stomatitis with exanthem: Secondary | ICD-10-CM

## 2023-12-23 MED ORDER — AMOXICILLIN-POT CLAVULANATE 875-125 MG PO TABS: 1.0000 | ORAL_TABLET | Freq: Two times a day (BID) | ORAL | 0 refills | Status: DC

## 2023-12-23 MED ORDER — LIDOCAINE VISCOUS HCL 2 % MT SOLN: 5.0000 mL | Freq: Four times a day (QID) | OROMUCOSAL | 0 refills | Status: DC

## 2023-12-23 NOTE — ED Triage Notes (Addendum)
 Pt reports fever, sore throat, body aches, mouth lesions, and rash to hands/feet x4 days. No fever today. Max fever = 102.2. Exposed to hand, foot, and mouth disease last week.   Pt was seen at a different UC earlier this week for mouth lesions and started on valtrex. Mouth lesions were her first symptom and she reports the provider told her the lesions were canker sores not related to hand/foot/mouth disease.

## 2023-12-28 NOTE — ED Provider Notes (Signed)
 [facility]    CSN: [id number] Arrival date & time: 12/23/23  1356      History   Chief Complaint Chief Complaint  Patient presents with   Mouth Lesions    Also has sore throat, fever, productive cough, small red bumps/dots on hands and feet, body aches. Exposure to hand, foot, & mouth a week ago. - Entered by patient   Sore Throat   Rash    HPI Amy [name] is a 23 y.o. female.   Patient here today for evaluation of sore throat, fever, productive cough and red bumps on hands and feet with associated body aches.  She reports that she has had exposure to hand-foot-and-mouth.  She denies any vomiting or diarrhea.  She has taken over-the-counter medication without resolution.  The history is provided by the patient.  Mouth Lesions Associated symptoms: congestion, fever, rash and sore throat   Associated symptoms: no ear pain   Sore Throat Pertinent negatives include no abdominal pain and no shortness of breath.  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   Cough 05/04/2022   Pain in right hand 04/08/2022   Endometriosis determined by laparoscopy 09/04/2020   Chronic pelvic pain in female    Family history of breast cancer 06/26/2020   Dyspareunia in female 06/26/2020   Mild intermittent asthma without complication 01/03/2019   Pollen-food allergy  syndrome, 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   Generalized anxiety disorder 05/14/2014    Past Surgical History:  Procedure Laterality  Date   ESOPHAGOGASTRODUODENOSCOPY (EGD) WITH PROPOFOL  N/A 02/02/2023   Procedure: ESOPHAGOGASTRODUODENOSCOPY (EGD) WITH PROPOFOL ;  Surgeon: Unk Corinn Skiff, MD;  Location: [facility];  Service: Gastroenterology;  Laterality: N/A;   LAPAROSCOPY N/A 07/22/2020   Procedure: LAPAROSCOPY DIAGNOSTIC;  Surgeon: Lake Read, MD;  Location: [facility] ORS;  Service: Gynecology;  Laterality: N/A;   OTHER SURGICAL HISTORY Right 10/2011   Right thumb   UPPER GI ENDOSCOPY  01/2023    OB History     Gravida  0   Para  0   Term  0   Preterm  0   AB  0   Living  0      SAB  0   IAB  0   Ectopic  0   Multiple  0   Live Births  0            Home Medications    Prior to Admission medications   Medication Sig Start Date End Date Taking? Authorizing Provider  amoxicillin -clavulanate (AUGMENTIN ) 875-125 MG tablet Take 1 tablet by mouth every 12 (twelve) hours. 12/23/23  Yes Billy Asberry FALCON, PA-C  magic mouthwash (lidocaine , diphenhydrAMINE, alum & mag hydroxide) suspension Swish and spit 5 mLs 4 (four) times daily. 12/23/23  Yes Billy Asberry FALCON, PA-C  magic mouthwash (nystatin, lidocaine , diphenhydrAMINE, alum & mag hydroxide) suspension 5 mLs.   Yes [provider]  montelukast  (SINGULAIR ) 10 MG tablet Take 1 tablet (10  mg total) by mouth at bedtime. 02/04/23  Yes Kozlow, Camellia PARAS, MD  omeprazole  (PRILOSEC) 40 MG capsule Take 1 capsule (40 mg total) by mouth in the morning and at bedtime. 10/12/23  Yes Kozlow, Camellia PARAS, MD  promethazine  (PHENERGAN ) 25 MG tablet Take 1 tablet (25 mg total) by mouth every 6 (six) hours as needed for nausea or vomiting. 10/11/23  Yes Zammit, Joseph, MD  SYMBICORT  160-4.5 MCG/ACT inhaler Inhale two puffs one to two times daily to prevent cough or wheeze.  Rinse, gargle, and spit after use. 02/10/23  Yes Kozlow, Eric J, MD  valACYclovir (VALTREX) 1000 MG tablet Take 1,000 mg by mouth 3 (three) times daily. 12/21/23  Yes [provider]   albuterol  (VENTOLIN  HFA) 108 (90 Base) MCG/ACT inhaler Inhale 2 puffs into the lungs every 6 (six) hours as needed for wheezing or shortness of breath (Cough). Patient not taking: Reported on 12/23/2023 10/17/22   Joesph Shaver Scales, PA-C  Albuterol -Budesonide  (AIRSUPRA ) 90-80 MCG/ACT AERO Inhale 2 puffs into the lungs every 4 (four) hours as needed. 07/18/23   Kozlow, Camellia PARAS, MD  Calcium-Phosphorus-Vitamin D (CALCIUM/D3 ADULT GUMMIES) 200-96.6-200 MG-MG-UNIT CHEW Chew 1 tablet by mouth daily.  Patient not taking: Reported on 12/23/2023    [provider]  cetirizine  (ZYRTEC  ALLERGY ) 10 MG tablet Take 1 tablet (10 mg total) by mouth at bedtime. 10/17/22 04/15/23  Joesph Shaver Scales, PA-C  DULoxetine (CYMBALTA) 60 MG capsule Take 60 mg by mouth daily. Patient not taking: Reported on 12/23/2023    [provider]  EPINEPHrine  (AUVI-Q ) 0.3 mg/0.3 mL IJ SOAJ injection Inject 0.3 mg into the muscle as needed for anaphylaxis. As directed for life-threatening allergic reactions 07/23/21   Kozlow, Camellia PARAS, MD  medroxyPROGESTERone  Acetate 150 MG/ML SUSY Inject 1 mL (150 mg total) into the muscle once for 1 dose. 11/03/21 01/17/23  Copland, Alicia B, PA-C  ondansetron  (ZOFRAN -ODT) 4 MG disintegrating tablet Take 1 tablet by mouth every 8 (eight) hours as needed. Patient not taking: Reported on 12/23/2023 07/13/23   [provider]    Family History Family History  Problem Relation Age of Onset   Asthma Mother    Migraines Maternal Grandmother    Depression Maternal Grandmother    Anxiety disorder Maternal Grandmother    Diabetes Maternal Grandmother    Hypertension Maternal Grandmother    Breast cancer Paternal Grandmother        45s   Migraines Maternal Uncle        Had migraines as a teen   ADD / ADHD Maternal Uncle    Breast cancer Other 60   Stomach cancer Other    Colon cancer Other     Social History Social History   Tobacco Use   Smoking status: Never    Smokeless tobacco: Never  Vaping Use   Vaping status: Never Used  Substance Use Topics   Alcohol use: Yes    Comment: occasionally   Drug use: No     Allergies   Fruit & vegetable daily [nutritional supplements] and Other   Review of Systems Review of Systems  Constitutional:  Positive for fever.  HENT:  Positive for congestion, mouth sores and sore throat. Negative for ear pain.   Eyes:  Negative for discharge and redness.  Respiratory:  Positive for cough. Negative for shortness of breath and wheezing.   Gastrointestinal:  Negative for abdominal pain, diarrhea, nausea and vomiting.  Musculoskeletal:  Positive for myalgias.  Skin:  Positive for rash.  Physical Exam Triage Vital Signs ED Triage Vitals  Encounter Vitals Group     BP 12/23/23 1437 128/87     Systolic BP Percentile --      Diastolic BP Percentile --      Pulse Rate 12/23/23 1437 (!) 113     Resp 12/23/23 1437 18     Temp 12/23/23 1437 98.7 F (37.1 C)     Temp Source 12/23/23 1437 Oral     SpO2 12/23/23 1437 97 %     Weight --      Height --      Head Circumference --      Peak Flow --      Pain Score 12/23/23 1438 9     Pain Loc --      Pain Education --      Exclude from Growth Chart --    No data found.  Updated Vital Signs BP 128/87 (BP Location: Left Arm)   Pulse (!) 113   Temp 98.7 F (37.1 C) (Oral)   Resp 18   LMP  (LMP Unknown)   SpO2 97%   Visual Acuity Right Eye Distance:   Left Eye Distance:   Bilateral Distance:    Right Eye Near:   Left Eye Near:    Bilateral Near:     Physical Exam Vitals and nursing note reviewed.  Constitutional:      General: She is not in acute distress.    Appearance: Normal appearance. She is not ill-appearing.  HENT:     Head: Normocephalic and atraumatic.     Nose: Congestion present.     Mouth/Throat:     Mouth: Mucous membranes are moist.     Pharynx: No oropharyngeal exudate or posterior oropharyngeal erythema.     Comments: See  photos attached Eyes:     Conjunctiva/sclera: Conjunctivae normal.  Cardiovascular:     Rate and Rhythm: Normal rate and regular rhythm.     Heart sounds: Normal heart sounds. No murmur heard. Pulmonary:     Effort: Pulmonary effort is normal. No respiratory distress.     Breath sounds: Normal breath sounds. No wheezing, rhonchi or rales.  Skin:    General: Skin is warm and dry.     Comments: Mildly erythematous papular lesions noted to feet and hands  Neurological:     Mental Status: She is alert.  Psychiatric:        Mood and Affect: Mood normal.        Thought Content: Thought content normal.           UC Treatments / Results  Labs (all labs ordered are listed, but only abnormal results are displayed) Labs Reviewed - No data to display  EKG   Radiology No results found.  Procedures Procedures (including critical care time)  Medications Ordered in UC Medications - No data to display  Initial Impression / Assessment and Plan / UC Course  I have reviewed the triage vital signs and the nursing notes.  Pertinent labs & imaging results that were available during my care of the patient were reviewed by me and considered in my medical decision making (see chart for details).     Suspect likely hand-foot-and-mouth disease.  Will treat with Magic mouthwash and Augmentin  prescribed for possible secondary infection given appearance of lesions.  Advised follow-up if no gradual improvement or with any worsening symptoms.  Patient expressed understanding.   Final Clinical Impressions(s) / UC Diagnoses   Final diagnoses:  Hand, foot and mouth disease   Discharge Instructions   None    ED Prescriptions     Medication Sig Dispense Auth. Provider   magic mouthwash (lidocaine , diphenhydrAMINE, alum & mag hydroxide) suspension Swish and spit 5 mLs 4 (four) times daily. 360 mL Billy Stabs F, PA-C   amoxicillin -clavulanate (AUGMENTIN ) 875-125 MG tablet Take 1 tablet  by mouth every 12 (twelve) hours. 14 tablet Billy Stabs FALCON, PA-C      PDMP not reviewed this encounter.   Billy Stabs FALCON, PA-C 12/28/23 1650

## 2023-12-29 ENCOUNTER — Ambulatory Visit (INDEPENDENT_AMBULATORY_CARE_PROVIDER_SITE_OTHER): Payer: Managed Care, Other (non HMO) | Admitting: *Deleted

## 2023-12-29 DIAGNOSIS — J309 Allergic rhinitis, unspecified: Secondary | ICD-10-CM | POA: Diagnosis not present

## 2024-01-05 ENCOUNTER — Ambulatory Visit (INDEPENDENT_AMBULATORY_CARE_PROVIDER_SITE_OTHER): Payer: Managed Care, Other (non HMO) | Admitting: *Deleted

## 2024-01-05 DIAGNOSIS — J309 Allergic rhinitis, unspecified: Secondary | ICD-10-CM

## 2024-01-12 ENCOUNTER — Ambulatory Visit (INDEPENDENT_AMBULATORY_CARE_PROVIDER_SITE_OTHER): Payer: Managed Care, Other (non HMO) | Admitting: *Deleted

## 2024-01-12 DIAGNOSIS — J309 Allergic rhinitis, unspecified: Secondary | ICD-10-CM | POA: Diagnosis not present

## 2024-01-16 ENCOUNTER — Ambulatory Visit: Payer: Managed Care, Other (non HMO) | Admitting: Allergy and Immunology

## 2024-01-25 ENCOUNTER — Encounter: Payer: Self-pay | Admitting: Allergy and Immunology

## 2024-01-25 ENCOUNTER — Ambulatory Visit (INDEPENDENT_AMBULATORY_CARE_PROVIDER_SITE_OTHER): Payer: Managed Care, Other (non HMO) | Admitting: Allergy and Immunology

## 2024-01-25 VITALS — BP 110/64 | HR 100 | Resp 18

## 2024-01-25 DIAGNOSIS — J3089 Other allergic rhinitis: Secondary | ICD-10-CM | POA: Diagnosis not present

## 2024-01-25 DIAGNOSIS — J301 Allergic rhinitis due to pollen: Secondary | ICD-10-CM

## 2024-01-25 DIAGNOSIS — J4531 Mild persistent asthma with (acute) exacerbation: Secondary | ICD-10-CM

## 2024-01-25 DIAGNOSIS — T781XXD Other adverse food reactions, not elsewhere classified, subsequent encounter: Secondary | ICD-10-CM | POA: Diagnosis not present

## 2024-01-25 DIAGNOSIS — K219 Gastro-esophageal reflux disease without esophagitis: Secondary | ICD-10-CM

## 2024-01-25 MED ORDER — AZITHROMYCIN 500 MG PO TABS: ORAL_TABLET | ORAL | 0 refills | Status: DC

## 2024-01-25 NOTE — Patient Instructions (Addendum)
  1.  Allergen avoidance measures -dust mite, cat, dog, other mammals, pollens, foods that give rise to oral reaction  2.  Continue to treat and prevent inflammation:  A. Symbicort  160 - 2 inhalation 1-2 times per day  B. OTC Nasacort  - 1 spray each nostril 1-7 times per week C. Montelukast  10 mg - 1 tablet 1 time per day D. Immunotherapy  3.  Continue to treat and prevent reflux/LPR/chest pain:  A. Minimize caffeine consumption B. Omeprazole  40 mg - 2 times per day  4.  If needed:  A. Airsupra  - 2 inhalations every 4-6 hours (Coupon) B. Cetirizine  10 mg - 1 tablet 1-2 times per day C. Pataday  - 1 drop each eye 1 time per day D. Auvi-Q  0.3, benadryl, MD/ER evaluation for allergic reaction  5. For this event:  A. Use nasal saline a few times per day B. Use mucinex DM - 2 times per day C. Use Symbicort  - 2 times per day D. Use omeprazole  - 2 times per day E. Azithromycin  500 mg - 1 tablet 1 time per day for 3 days F. Finish prednisone  G. Further treatment???  5.  Return to clinic in 6 months or earlier if problem

## 2024-01-25 NOTE — Progress Notes (Signed)
 [place] [place] [facility] [place] [facility] [place]   Follow-up Note  Referring Provider: Franchot Houston, PA-C Primary Provider: Patient, No Pcp Per Date of Office Visit: 01/25/2024  Subjective:   Amy [name] (DOB: 05/14/2001) is a 23 y.o. female who [facility] in re-evaluation of the following:  HPI: Amy [name] returns to this clinic in evaluation of asthma, allergic rhinitis, oral allergy  syndrome, anosmia, LPR.  I last saw her in this clinic 18 July 2023.  For the past 2 weeks she has had some nasal congestion and cough and ear fullness and some of the sputum that she produces is productive.  This event correlated with exposure to someone who had active RSV.  She went to see her primary care doctor and was treated with some prednisone  last week and her ears are better but she still has some cough and she still needs to use her bronchodilator on a pretty regular basis.  Prior to this event she has really done very well and has had very little problems with either her upper or lower airway.  She continues on immunotherapy currently at every week.  She continues on a collection of anti-inflammatory agents for her airway.  She thinks that her reflux was doing relatively well on her current plan.  She did receive the flu vaccine this year.  Allergies as of 01/25/2024       Reactions   Fruit & Vegetable Daily [nutritional Supplements] Itching   All fruit   Other    Seasonal Allergies        Medication List    Airsupra  90-80 MCG/ACT Aero Generic drug: Albuterol -Budesonide  Inhale 2 puffs into the lungs every 4 (four) hours as needed.   albuterol  108 (90 Base) MCG/ACT inhaler Commonly known as: VENTOLIN  HFA Inhale 2 puffs into the lungs every 6 (six) hours as needed for wheezing or shortness of breath (Cough).   amoxicillin -clavulanate 875-125 MG tablet Commonly known as: AUGMENTIN  Take 1 tablet by mouth every 12 (twelve)  hours.   azithromycin  500 MG tablet Commonly known as: Zithromax  Take one tablet once daily for 3 days Started by: Abie Killian J Alec Mcphee   Calcium/D3 Adult Gummies 200-96.6-200 MG-MG-UNIT Chew Generic drug: Calcium-Phosphorus-Vitamin D Chew 1 tablet by mouth daily.   cetirizine  10 MG tablet Commonly known as: ZyrTEC  Allergy  Take 1 tablet (10 mg total) by mouth at bedtime.   DULoxetine 60 MG capsule Commonly known as: CYMBALTA Take 60 mg by mouth daily.   EPINEPHrine  0.3 mg/0.3 mL Soaj injection Commonly known as: Auvi-Q  Inject 0.3 mg into the muscle as needed for anaphylaxis. As directed for life-threatening allergic reactions   magic mouthwash (lidocaine , diphenhydrAMINE, alum & mag hydroxide) suspension Swish and spit 5 mLs 4 (four) times daily.   magic mouthwash (nystatin, lidocaine , diphenhydrAMINE, alum & mag hydroxide) suspension 5 mLs.   medroxyPROGESTERone  Acetate 150 MG/ML Susy Inject 1 mL (150 mg total) into the muscle once for 1 dose.   montelukast  10 MG tablet Commonly known as: SINGULAIR  Take 1 tablet (10 mg total) by mouth at bedtime.   omeprazole  40 MG capsule Commonly known as: PRILOSEC Take 1 capsule (40 mg total) by mouth in the morning and at bedtime.   ondansetron  4 MG disintegrating tablet Commonly known as: ZOFRAN -ODT Take 1 tablet by mouth every 8 (eight) hours as needed.   predniSONE  10 MG tablet Commonly known as: DELTASONE  Take 1 tablet by mouth daily.   promethazine  25  MG tablet Commonly known as: PHENERGAN  Take 1 tablet (25 mg total) by mouth every 6 (six) hours as needed for nausea or vomiting.   Symbicort  160-4.5 MCG/ACT inhaler Generic drug: budesonide -formoterol  Inhale two puffs one to two times daily to prevent cough or wheeze.  Rinse, gargle, and spit after use.   valACYclovir 1000 MG tablet Commonly known as: VALTREX Take 1,000 mg by mouth 3 (three) times daily.    Past Medical History:  Diagnosis Date   Asthma    Broken arm     Headache    Vaccine for human papilloma virus (HPV) types 6, 11, 16, and 18 administered     Past Surgical History:  Procedure Laterality Date   ESOPHAGOGASTRODUODENOSCOPY (EGD) WITH PROPOFOL  N/A 02/02/2023   Procedure: ESOPHAGOGASTRODUODENOSCOPY (EGD) WITH PROPOFOL ;  Surgeon: Unk Corinn Skiff, MD;  Location: [facility];  Service: Gastroenterology;  Laterality: N/A;   LAPAROSCOPY N/A 07/22/2020   Procedure: LAPAROSCOPY DIAGNOSTIC;  Surgeon: Lake Read, MD;  Location: [facility];  Service: Gynecology;  Laterality: N/A;   OTHER SURGICAL HISTORY Right 10/2011   Right thumb   UPPER GI ENDOSCOPY  01/2023    Review of systems negative except as noted in HPI / PMHx or noted below:  Review of Systems  Constitutional: Negative.   HENT: Negative.    Eyes: Negative.   Respiratory: Negative.    Cardiovascular: Negative.   Gastrointestinal: Negative.   Genitourinary: Negative.   Musculoskeletal: Negative.   Skin: Negative.   Neurological: Negative.   Endo/Heme/Allergies: Negative.   Psychiatric/Behavioral: Negative.       Objective:   Vitals:   01/25/24 1552  BP: 110/64  Pulse: 100  Resp: 18  SpO2: 95%          Physical Exam Constitutional:      Appearance: She is not diaphoretic.  HENT:     Head: Normocephalic.     Right Ear: Tympanic membrane, ear canal and external ear normal.     Left Ear: Tympanic membrane, ear canal and external ear normal.     Nose: Nose normal. No mucosal edema or rhinorrhea.     Mouth/Throat:     Pharynx: Uvula midline. No oropharyngeal exudate.  Eyes:     Conjunctiva/sclera: Conjunctivae normal.  Neck:     Thyroid: No thyromegaly.     Trachea: Trachea normal. No tracheal tenderness or tracheal deviation.  Cardiovascular:     Rate and Rhythm: Normal rate and regular rhythm.     Heart sounds: Normal heart sounds, S1 normal and S2 normal. No murmur heard. Pulmonary:     Effort: No respiratory distress.     Breath sounds:  Normal breath sounds. No stridor. No wheezing or rales.  Lymphadenopathy:     Head:     Right side of head: No tonsillar adenopathy.     Left side of head: No tonsillar adenopathy.     Cervical: No cervical adenopathy.  Skin:    Findings: No erythema or rash.     Nails: There is no clubbing.  Neurological:     Mental Status: She is alert.     Diagnostics: none  Assessment and Plan:   1. Asthma, not well controlled, mild persistent, with acute exacerbation   2. Perennial allergic rhinitis   3. Seasonal allergic rhinitis due to pollen   4. Pollen-food allergy , subsequent encounter   5. LPRD (laryngopharyngeal reflux disease)    1.  Allergen avoidance measures -dust mite, cat, dog, other mammals, pollens, foods that give rise  to oral reaction  2.  Continue to treat and prevent inflammation:  A. Symbicort  160 - 2 inhalation 1-2 times per day  B. OTC Nasacort  - 1 spray each nostril 1-7 times per week C. Montelukast  10 mg - 1 tablet 1 time per day D. Immunotherapy  3.  Continue to treat and prevent reflux/LPR/chest pain:  A. Minimize caffeine consumption B. Omeprazole  40 mg - 2 times per day  4.  If needed:  A. Airsupra  - 2 inhalations every 4-6 hours (Coupon) B. Cetirizine  10 mg - 1 tablet 1-2 times per day C. Pataday  - 1 drop each eye 1 time per day D. Auvi-Q  0.3, benadryl, MD/ER evaluation for allergic reaction  5. For this event:  A. Use nasal saline a few times per day B. Use mucinex DM - 2 times per day C. Use Symbicort  - 2 times per day D. Use omeprazole  - 2 times per day E. Azithromycin  500 mg - 1 tablet 1 time per day for 3 days F. Finish prednisone  G. Further treatment???  5.  Return to clinic in 6 months or earlier if problem  Ameria most likely had RSV infection of her airway based upon her history and she is having some persistent lingering symptoms starting into the third week of this issue and to cover the possibility of a secondary bacterial  infection I will treat her with azithromycin  and I have encouraged her to consistently use her Symbicort  twice a day and while she is coughing to use her omeprazole  twice a day.  Will see what happens with this approach.  If she does well then she can go back to using a collection of anti-inflammatory agents for her airway including immunotherapy and also continue on omeprazole  once a day for her reflux.  Camellia Denis, MD Allergy  / Immunology [facility]

## 2024-01-26 ENCOUNTER — Encounter: Payer: Self-pay | Admitting: Allergy and Immunology

## 2024-02-06 ENCOUNTER — Ambulatory Visit (INDEPENDENT_AMBULATORY_CARE_PROVIDER_SITE_OTHER): Payer: Self-pay | Admitting: *Deleted

## 2024-02-06 DIAGNOSIS — J309 Allergic rhinitis, unspecified: Secondary | ICD-10-CM

## 2024-02-20 ENCOUNTER — Ambulatory Visit (INDEPENDENT_AMBULATORY_CARE_PROVIDER_SITE_OTHER): Payer: Self-pay | Admitting: *Deleted

## 2024-02-20 DIAGNOSIS — J309 Allergic rhinitis, unspecified: Secondary | ICD-10-CM

## 2024-02-23 ENCOUNTER — Ambulatory Visit
Admission: EM | Admit: 2024-02-23 | Discharge: 2024-02-23 | Disposition: A | Discharge status: Home / Self Care | Attending: Family Medicine | Admitting: Family Medicine

## 2024-02-23 DIAGNOSIS — R112 Nausea with vomiting, unspecified: Secondary | ICD-10-CM

## 2024-02-23 HISTORY — DX: Allergy status to unspecified drugs, medicaments and biological substances: Z88.9

## 2024-02-23 LAB — POCT URINALYSIS DIP (MANUAL ENTRY)
Bilirubin, UA: NEGATIVE
Blood, UA: NEGATIVE
Glucose, UA: NEGATIVE mg/dL
Ketones, POC UA: NEGATIVE mg/dL
Leukocytes, UA: NEGATIVE
Nitrite, UA: NEGATIVE
Protein Ur, POC: NEGATIVE mg/dL
Spec Grav, UA: 1.01
Urobilinogen, UA: 0.2 U/dL
pH, UA: 6

## 2024-02-23 LAB — POCT URINE PREGNANCY: Preg Test, Ur: NEGATIVE

## 2024-02-23 MED ORDER — PROMETHAZINE HCL 25 MG PO TABS: 12.5000 mg | ORAL_TABLET | Freq: Four times a day (QID) | ORAL | 0 refills | Status: AC | PRN

## 2024-02-23 MED ORDER — DICYCLOMINE HCL 20 MG PO TABS: 20.0000 mg | ORAL_TABLET | Freq: Two times a day (BID) | ORAL | 0 refills | Status: DC

## 2024-02-23 NOTE — ED Provider Notes (Signed)
 [facility]  Note:  This document was prepared using Conservation officer, historic buildings and may include unintentional dictation errors.  MRN: [id number] DOB: Jun 28, 2001  Subjective:   Amy [name] is a 23 y.o. female presenting for 1 day history of acute on chronic nausea, vomiting.  Has developed abdominal pain across the upper abdomen that wraps around to the back.  Has had similar symptoms over the past 2 years.  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  Currently does not take any chronic medications.  Has previously not responded to Zofran.  Has used promethazine but makes her very sleepy.  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  No bloody stools.  No recent antibiotics.  No hospitalizations.  No current facility-administered medications for this encounter.  Current Outpatient Medications:    albuterol (VENTOLIN HFA) 108 (90 Base) MCG/ACT inhaler, Inhale 2 puffs into the lungs every 6 (six) hours as needed for wheezing or shortness of breath (Cough)., Disp: 18 g, Rfl: 0   Albuterol-Budesonide (AIRSUPRA) 90-80 MCG/ACT AERO, Inhale 2 puffs into the lungs every 4 (four) hours as needed., Disp: 10.7 g, Rfl: 3   amoxicillin-clavulanate (AUGMENTIN) 875-125 MG tablet, Take 1 tablet by mouth every 12 (twelve) hours., Disp: 14 tablet, Rfl: 0   azithromycin (ZITHROMAX) 500 MG tablet, Take one tablet once daily for 3 days, Disp: 3 tablet, Rfl: 0   Calcium-Phosphorus-Vitamin D (CALCIUM/D3 ADULT GUMMIES) 200-96.6-200 MG-MG-UNIT CHEW, Chew 1 tablet by mouth daily., Disp: , Rfl:    cetirizine (ZYRTEC ALLERGY) 10 MG tablet, Take 1 tablet (10 mg total) by mouth at bedtime., Disp: 90 tablet, Rfl: 1   DULoxetine (CYMBALTA) 60 MG capsule, Take 60 mg by mouth daily., Disp: , Rfl:    EPINEPHrine (AUVI-Q) 0.3 mg/0.3 mL IJ SOAJ injection, Inject 0.3 mg into the muscle as needed for anaphylaxis. As directed for life-threatening allergic  reactions, Disp: 2 each, Rfl: 1   magic mouthwash (lidocaine, diphenhydrAMINE, alum & mag hydroxide) suspension, Swish and spit 5 mLs 4 (four) times daily., Disp: 360 mL, Rfl: 0   magic mouthwash (nystatin, lidocaine, diphenhydrAMINE, alum & mag hydroxide) suspension, 5 mLs., Disp: , Rfl:    medroxyPROGESTERone Acetate 150 MG/ML SUSY, Inject 1 mL (150 mg total) into the muscle once for 1 dose., Disp: 1 mL, Rfl: 0   montelukast (SINGULAIR) 10 MG tablet, Take 1 tablet (10 mg total) by mouth at bedtime., Disp: 30 tablet, Rfl: 5   omeprazole (PRILOSEC) 40 MG capsule, Take 1 capsule (40 mg total) by mouth in the morning and at bedtime., Disp: 60 capsule, Rfl: 3   ondansetron (ZOFRAN-ODT) 4 MG disintegrating tablet, Take 1 tablet by mouth every 8 (eight) hours as needed., Disp: , Rfl:    predniSONE (DELTASONE) 10 MG tablet, Take 1 tablet by mouth daily., Disp: , Rfl:    promethazine (PHENERGAN) 25 MG tablet, Take 1 tablet (25 mg total) by mouth every 6 (six) hours as needed for nausea or vomiting., Disp: 30 tablet, Rfl: 0   SYMBICORT 160-4.5 MCG/ACT inhaler, Inhale two puffs one to two times daily to prevent cough or wheeze.  Rinse, gargle, and spit after use., Disp: 10.2 g, Rfl: 5   valACYclovir (VALTREX) 1000 MG tablet, Take 1,000 mg by mouth 3 (three) times daily., Disp: , Rfl:    Allergies  Allergen Reactions   Other     ALL FRUIT AND VEGETABLES PER PT CAUSES "ORAL ALLERGY SYNDROME"  Past Medical History:  Diagnosis Date   Asthma    Broken arm    Headache    History of seasonal allergies    PER PT   Vaccine for human papilloma virus (HPV) types 6, 11, 16, and 18 administered      Past Surgical History:  Procedure Laterality Date   ESOPHAGOGASTRODUODENOSCOPY (EGD) WITH PROPOFOL N/A 02/02/2023   Procedure: ESOPHAGOGASTRODUODENOSCOPY (EGD) WITH PROPOFOL;  Surgeon: Toney Reil, MD;  Location: [facility];  Service: Gastroenterology;  Laterality: N/A;   LAPAROSCOPY N/A  07/22/2020   Procedure: LAPAROSCOPY DIAGNOSTIC;  Surgeon: Vena Austria, MD;  Location: [facility] ORS;  Service: Gynecology;  Laterality: N/A;   OTHER SURGICAL HISTORY Right 10/2011   Right thumb   UPPER GI ENDOSCOPY  01/2023    Family History  Problem Relation Age of Onset   Asthma Mother    Migraines Maternal Grandmother    Depression Maternal Grandmother    Anxiety disorder Maternal Grandmother    Diabetes Maternal Grandmother    Hypertension Maternal Grandmother    Breast cancer Paternal Grandmother        13s   Migraines Maternal Uncle        Had migraines as a teen   ADD / ADHD Maternal Uncle    Breast cancer Other 60   Stomach cancer Other    Colon cancer Other     Social History   Tobacco Use   Smoking status: Never   Smokeless tobacco: Never  Vaping Use   Vaping status: Never Used  Substance Use Topics   Alcohol use: Yes    Comment: occasionally   Drug use: No    ROS   Objective:   Vitals: BP 118/76 (BP Location: Right Arm)   Pulse 79   Temp 98.9 F (37.2 C) (Oral)   Resp 16   SpO2 98%   Physical Exam Constitutional:      General: She is not in acute distress.    Appearance: Normal appearance. She is well-developed. She is not ill-appearing, toxic-appearing or diaphoretic.  HENT:     Head: Normocephalic and atraumatic.     Nose: Nose normal.     Mouth/Throat:     Mouth: Mucous membranes are moist.     Pharynx: Oropharynx is clear.  Eyes:     General: No scleral icterus.       Right eye: No discharge.        Left eye: No discharge.     Extraocular Movements: Extraocular movements intact.     Conjunctiva/sclera: Conjunctivae normal.  Cardiovascular:     Rate and Rhythm: Normal rate.  Pulmonary:     Effort: Pulmonary effort is normal.  Abdominal:     General: Bowel sounds are normal. There is no distension.     Palpations: Abdomen is soft. There is no mass.     Tenderness: There is generalized abdominal tenderness (mild) and tenderness in  the right upper quadrant, epigastric area and left upper quadrant. There is no right CVA tenderness, left CVA tenderness, guarding or rebound.  Skin:    General: Skin is warm and dry.  Neurological:     General: No focal deficit present.     Mental Status: She is alert and oriented to person, place, and time.  Psychiatric:        Mood and Affect: Mood normal.        Behavior: Behavior normal.        Thought Content: Thought content normal.  Judgment: Judgment normal.     Results for orders placed or performed during the hospital encounter of 02/23/24 (from the past 24 hours)  POCT urinalysis dipstick     Status: Abnormal   Collection Time: 02/23/24  9:47 AM  Result Value Ref Range   Color, UA light yellow (A)    Clarity, UA clear    Glucose, UA negative mg/dL   Bilirubin, UA negative    Ketones, POC UA negative mg/dL   Spec Grav, UA 1.610    Blood, UA negative    pH, UA 6.0    Protein Ur, POC negative mg/dL   Urobilinogen, UA 0.2 E.U./dL   Nitrite, UA Negative    Leukocytes, UA Negative   POCT urine pregnancy     Status: Normal   Collection Time: 02/23/24  9:48 AM  Result Value Ref Range   Preg Test, Ur Negative     Assessment and Plan :   PDMP not reviewed this encounter.  1. Nausea and vomiting, unspecified vomiting type    Suspect acute on chronic nausea, vomiting, abdominal pain.  No signs of an acute abdomen.  Discussed options for symptomatic relief.  Patient is following up with a new gastroenterologist.  Philemon Kingdom, can continue using promethazine as needed.  Counseled patient on potential for adverse effects with medications prescribed/recommended today, ER and return-to-clinic precautions discussed, patient verbalized understanding.    Wallis Bamberg, [place] 02/23/24 1014

## 2024-02-23 NOTE — ED Triage Notes (Signed)
 Pt c/o generalized abd and mid back pain started yesterday-n/v x this am-NAD-steady gait

## 2024-02-23 NOTE — Discharge Instructions (Addendum)
 Make sure you push fluids drinking mostly water but mix it with Gatorade.  Try to eat light meals including soups, broths and soft foods, fruits.  You may use Bentyl for your nausea and vomiting once every 8 hours.  Keep trying your promethazine if this does help you.  Please return to the clinic if symptoms worsen or you start having severe abdominal pain not helped by taking Tylenol or start having bloody stools or blood in the vomit.

## 2024-02-29 ENCOUNTER — Ambulatory Visit (INDEPENDENT_AMBULATORY_CARE_PROVIDER_SITE_OTHER): Payer: Self-pay

## 2024-02-29 DIAGNOSIS — J309 Allergic rhinitis, unspecified: Secondary | ICD-10-CM | POA: Diagnosis not present

## 2024-03-15 ENCOUNTER — Ambulatory Visit (INDEPENDENT_AMBULATORY_CARE_PROVIDER_SITE_OTHER): Payer: Self-pay | Admitting: *Deleted

## 2024-03-15 DIAGNOSIS — J309 Allergic rhinitis, unspecified: Secondary | ICD-10-CM

## 2024-03-22 ENCOUNTER — Ambulatory Visit (INDEPENDENT_AMBULATORY_CARE_PROVIDER_SITE_OTHER): Payer: Self-pay | Admitting: *Deleted

## 2024-03-22 DIAGNOSIS — J309 Allergic rhinitis, unspecified: Secondary | ICD-10-CM

## 2024-03-27 DIAGNOSIS — J3089 Other allergic rhinitis: Secondary | ICD-10-CM | POA: Diagnosis not present

## 2024-03-27 NOTE — Progress Notes (Signed)
 VIALS MADE 03-27-24. EXP 03-27-25

## 2024-03-28 DIAGNOSIS — J3081 Allergic rhinitis due to animal (cat) (dog) hair and dander: Secondary | ICD-10-CM | POA: Diagnosis not present

## 2024-03-29 ENCOUNTER — Ambulatory Visit (INDEPENDENT_AMBULATORY_CARE_PROVIDER_SITE_OTHER): Payer: Self-pay | Admitting: *Deleted

## 2024-03-29 DIAGNOSIS — J309 Allergic rhinitis, unspecified: Secondary | ICD-10-CM | POA: Diagnosis not present

## 2024-04-12 ENCOUNTER — Ambulatory Visit (INDEPENDENT_AMBULATORY_CARE_PROVIDER_SITE_OTHER): Payer: Self-pay | Admitting: *Deleted

## 2024-04-12 DIAGNOSIS — J309 Allergic rhinitis, unspecified: Secondary | ICD-10-CM | POA: Diagnosis not present

## 2024-04-19 ENCOUNTER — Ambulatory Visit (INDEPENDENT_AMBULATORY_CARE_PROVIDER_SITE_OTHER): Payer: Self-pay

## 2024-04-19 DIAGNOSIS — J309 Allergic rhinitis, unspecified: Secondary | ICD-10-CM | POA: Diagnosis not present

## 2024-04-26 ENCOUNTER — Ambulatory Visit (INDEPENDENT_AMBULATORY_CARE_PROVIDER_SITE_OTHER): Payer: Self-pay | Admitting: *Deleted

## 2024-04-26 DIAGNOSIS — J309 Allergic rhinitis, unspecified: Secondary | ICD-10-CM

## 2024-05-08 ENCOUNTER — Ambulatory Visit (INDEPENDENT_AMBULATORY_CARE_PROVIDER_SITE_OTHER): Admitting: *Deleted

## 2024-05-08 ENCOUNTER — Encounter: Payer: Self-pay | Admitting: *Deleted

## 2024-05-08 DIAGNOSIS — J309 Allergic rhinitis, unspecified: Secondary | ICD-10-CM | POA: Diagnosis not present

## 2024-05-12 ENCOUNTER — Other Ambulatory Visit: Payer: Self-pay

## 2024-05-12 ENCOUNTER — Ambulatory Visit
Admission: EM | Admit: 2024-05-12 | Discharge: 2024-05-12 | Disposition: A | Discharge status: Home / Self Care | Attending: Emergency Medicine | Admitting: Emergency Medicine

## 2024-05-12 ENCOUNTER — Encounter: Payer: Self-pay | Admitting: *Deleted

## 2024-05-12 DIAGNOSIS — R300 Dysuria: Secondary | ICD-10-CM | POA: Insufficient documentation

## 2024-05-12 LAB — POCT URINALYSIS DIP (MANUAL ENTRY)
Bilirubin, UA: NEGATIVE
Glucose, UA: NEGATIVE mg/dL
Ketones, POC UA: NEGATIVE mg/dL
Nitrite, UA: NEGATIVE
Protein Ur, POC: 100 mg/dL — AB
Spec Grav, UA: 1.025
Urobilinogen, UA: 0.2 U/dL
pH, UA: 7

## 2024-05-12 MED ORDER — NITROFURANTOIN MONOHYD MACRO 100 MG PO CAPS: 100.0000 mg | ORAL_CAPSULE | Freq: Two times a day (BID) | ORAL | 0 refills | Status: AC

## 2024-05-12 NOTE — ED Triage Notes (Addendum)
 PT reports she has dysuria and frquency. Pt is prone to UTI's. PT took OTC Azo.

## 2024-05-12 NOTE — ED Provider Notes (Signed)
 Amy [name]    CSN: [id number] Arrival date & time: 05/12/24  1030      History   Chief Complaint Chief Complaint  Patient presents with   UTI    HPI Amy [name] is a 23 y.o. female.   Patient presents for evaluation of urinary frequency, dysuria and lower abdominal pain beginning 2 to 3 days ago.  Has attempted use of Azo which has been helpful.  Denies flank pain, fever or vaginal symptoms.  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   Cough 05/04/2022   Pain in right hand 04/08/2022   Endometriosis determined by laparoscopy 09/04/2020   Chronic pelvic pain in female    Family history of breast cancer 06/26/2020   Dyspareunia in female 06/26/2020   Mild intermittent asthma without complication 01/03/2019   Pollen-food allergy  syndrome, 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   Generalized anxiety disorder 05/14/2014    Past Surgical History:  Procedure Laterality Date   ESOPHAGOGASTRODUODENOSCOPY (EGD) WITH PROPOFOL  N/A 02/02/2023   Procedure: ESOPHAGOGASTRODUODENOSCOPY (EGD) WITH PROPOFOL ;  Surgeon: Selena Daily, MD;  Location: [facility];  Service: Gastroenterology;  Laterality: N/A;   LAPAROSCOPY N/A 07/22/2020   Procedure: LAPAROSCOPY DIAGNOSTIC;  Surgeon: Darl Edu, MD;  Location: [facility];  Service: Gynecology;  Laterality: N/A;   OTHER SURGICAL HISTORY Right 10/2011   Right thumb   UPPER GI ENDOSCOPY  01/2023    OB History     Gravida  0   Para  0   Term  0   Preterm  0   AB  0   Living  0      SAB  0   IAB  0   Ectopic  0   Multiple  0   Live Births  0             Home Medications    Prior to Admission medications   Medication Sig Start Date End Date Taking? Authorizing Provider  nitrofurantoin, macrocrystal-monohydrate, (MACROBID) 100 MG capsule Take 1 capsule (100 mg total) by mouth 2 (two) times daily. 05/12/24  Yes Ladanian Kelter, Maybelle Spatz, NP  albuterol  (VENTOLIN  HFA) 108 (90 Base) MCG/ACT inhaler Inhale 2 puffs into the lungs every 6 (six) hours as needed for wheezing or shortness of breath (Cough). 10/17/22   Eloise Hake Scales, PA-C  Albuterol -Budesonide (AIRSUPRA ) 90-80 MCG/ACT AERO Inhale 2 puffs into the lungs every 4 (four) hours as needed. 07/18/23  Yes Kozlow, Eric J, MD  amoxicillin -clavulanate (AUGMENTIN ) 875-125 MG tablet Take 1 tablet by mouth every 12 (twelve) hours. 12/23/23   Vernestine Gondola, PA-C  azithromycin  (ZITHROMAX ) 500 MG tablet Take one tablet once daily for 3 days 01/25/24   Kozlow, Rema Care, MD  Calcium-Phosphorus-Vitamin D (CALCIUM/D3 ADULT GUMMIES) 200-96.6-200 MG-MG-UNIT CHEW Chew 1 tablet by mouth daily.    [provider]  cetirizine  (ZYRTEC  ALLERGY ) 10 MG tablet Take 1 tablet (10 mg total) by mouth at bedtime. 10/17/22 05/12/24 Yes Eloise Hake Scales, PA-C  dicyclomine  (BENTYL ) 20 MG tablet Take 1 tablet (20 mg total) by mouth 2 (two)  times daily. 02/23/24  Yes Adolph Hoop, PA-C  DULoxetine (CYMBALTA) 60 MG capsule Take 60 mg by mouth daily.    [provider]  EPINEPHrine  (AUVI-Q ) 0.3 mg/0.3 mL IJ SOAJ injection Inject 0.3 mg into the muscle as needed for anaphylaxis. As directed for life-threatening allergic reactions 07/23/21   Kozlow, Eric J, MD  magic mouthwash (lidocaine , diphenhydrAMINE, alum & mag hydroxide) suspension Swish and spit 5 mLs 4 (four) times daily. 12/23/23   Vernestine Gondola, PA-C  magic mouthwash (nystatin, lidocaine , diphenhydrAMINE, alum & mag hydroxide) suspension 5 mLs.    [provider]  medroxyPROGESTERone  Acetate 150 MG/ML SUSY Inject 1 mL (150 mg total) into the muscle  once for 1 dose. 11/03/21 01/17/23  Copland, Alicia B, PA-C  montelukast  (SINGULAIR ) 10 MG tablet Take 1 tablet (10 mg total) by mouth at bedtime. 02/04/23  Yes Kozlow, Rema Care, MD  omeprazole  (PRILOSEC) 40 MG capsule Take 1 capsule (40 mg total) by mouth in the morning and at bedtime. 10/12/23  Yes Kozlow, Rema Care, MD  predniSONE  (DELTASONE ) 10 MG tablet Take 1 tablet by mouth daily. 01/17/24   [provider]  promethazine  (PHENERGAN ) 25 MG tablet Take 0.5-1 tablets (12.5-25 mg total) by mouth every 6 (six) hours as needed for nausea or vomiting. 02/23/24  Yes Adolph Hoop, PA-C  SYMBICORT  160-4.5 MCG/ACT inhaler Inhale two puffs one to two times daily to prevent cough or wheeze.  Rinse, gargle, and spit after use. 02/10/23  Yes Kozlow, Eric J, MD  valACYclovir (VALTREX) 1000 MG tablet Take 1,000 mg by mouth 3 (three) times daily. 12/21/23   [provider]    Family History Family History  Problem Relation Age of Onset   Asthma Mother    Migraines Maternal Grandmother    Depression Maternal Grandmother    Anxiety disorder Maternal Grandmother    Diabetes Maternal Grandmother    Hypertension Maternal Grandmother    Breast cancer Paternal Grandmother        47s   Migraines Maternal Uncle        Had migraines as a teen   ADD / ADHD Maternal Uncle    Breast cancer Other 60   Stomach cancer Other    Colon cancer Other     Social History Social History   Tobacco Use   Smoking status: Never   Smokeless tobacco: Never  Vaping Use   Vaping status: Never Used  Substance Use Topics   Alcohol use: Yes    Comment: occasionally   Drug use: No     Allergies   Other   Review of Systems Review of Systems   Physical Exam Triage Vital Signs ED Triage Vitals  Encounter Vitals Group     BP 05/12/24 1102 118/78     Systolic BP Percentile --      Diastolic BP Percentile --      Pulse Rate 05/12/24 1102 91     Resp 05/12/24 1102 18     Temp 05/12/24 1102 98.2 F (36.8  C)     Temp src --      SpO2 05/12/24 1102 99 %     Weight --      Height --      Head Circumference --      Peak Flow --      Pain Score 05/12/24 1056 3     Pain Loc --      Pain Education --      Exclude from [employer]  Chart --    No data found.  Updated Vital Signs BP 118/78   Pulse 91   Temp 98.2 F (36.8 C)   Resp 18   SpO2 99%   Visual Acuity Right Eye Distance:   Left Eye Distance:   Bilateral Distance:    Right Eye Near:   Left Eye Near:    Bilateral Near:     Physical Exam Constitutional:      Appearance: Normal appearance.  Pulmonary:     Effort: Pulmonary effort is normal.  Abdominal:     Tenderness: There is abdominal tenderness in the suprapubic area. There is no right CVA tenderness or left CVA tenderness.  Neurological:     Mental Status: She is alert and oriented to person, place, and time.      UC Treatments / Results  Labs (all labs ordered are listed, but only abnormal results are displayed) Labs Reviewed  POCT URINALYSIS DIP (MANUAL ENTRY) - Abnormal; Notable for the following components:      Result Value   Clarity, UA cloudy (*)    Blood, UA small (*)    Protein Ur, POC =100 (*)    Leukocytes, UA Moderate (2+) (*)    All other components within normal limits  URINE CULTURE    EKG   Radiology No results found.  Procedures Procedures (including critical care time)  Medications Ordered in UC Medications - No data to display  Initial Impression / Assessment and Plan / UC Course  I have reviewed the triage vital signs and the nursing notes.  Pertinent labs & imaging results that were available during my care of the patient were reviewed by me and considered in my medical decision making (see chart for details).  Dysuria  Urinalysis showing leukocytes, negative for nitrates, sent for culture, empirically providing coverage as patient is symptomatic, Macrobid sent to pharmacy and discussed supportive care with follow-up as  needed Final Clinical Impressions(s) / UC Diagnoses   Final diagnoses:  Dysuria   Discharge Instructions      Your urinalysis does not show bacteria at this time, your urine will be sent to the lab to determine exactly which bacteria is present, if any changes need to be made to your medications you will be notified  Begin use of Macrobid twice daily for 5 days  You may use over-the-counter Azo to help minimize your symptoms until antibiotic removes bacteria, this medication will turn your urine orange  Increase your fluid intake through use of water  As always practice good hygiene, wiping front to back and avoidance of scented vaginal products to prevent further irritation  If symptoms continue to persist after use of medication or recur please follow-up with urgent care or your primary doctor as needed   ED Prescriptions     Medication Sig Dispense Auth. Provider   nitrofurantoin, macrocrystal-monohydrate, (MACROBID) 100 MG capsule Take 1 capsule (100 mg total) by mouth 2 (two) times daily. 10 capsule Ottis Vacha R, NP      PDMP not reviewed this encounter.   Reena Canning, NP 05/12/24 1115

## 2024-05-12 NOTE — Discharge Instructions (Signed)
 Your urinalysis does not show bacteria at this time, your urine will be sent to the lab to determine exactly which bacteria is present, if any changes need to be made to your medications you will be notified  Begin use of Macrobid  twice daily for 5 days  You may use over-the-counter Azo to help minimize your symptoms until antibiotic removes bacteria, this medication will turn your urine orange  Increase your fluid intake through use of water  As always practice good hygiene, wiping front to back and avoidance of scented vaginal products to prevent further irritation  If symptoms continue to persist after use of medication or recur please follow-up with urgent care or your primary doctor as needed

## 2024-05-13 LAB — URINE CULTURE: Culture: <10000 — AB

## 2024-05-14 ENCOUNTER — Ambulatory Visit (HOSPITAL_COMMUNITY): Payer: Self-pay

## 2024-05-22 ENCOUNTER — Ambulatory Visit (INDEPENDENT_AMBULATORY_CARE_PROVIDER_SITE_OTHER): Payer: Self-pay | Admitting: *Deleted

## 2024-05-22 DIAGNOSIS — J309 Allergic rhinitis, unspecified: Secondary | ICD-10-CM | POA: Diagnosis not present

## 2024-05-31 ENCOUNTER — Ambulatory Visit (INDEPENDENT_AMBULATORY_CARE_PROVIDER_SITE_OTHER): Payer: Self-pay | Admitting: *Deleted

## 2024-05-31 DIAGNOSIS — J309 Allergic rhinitis, unspecified: Secondary | ICD-10-CM

## 2024-06-05 ENCOUNTER — Ambulatory Visit (INDEPENDENT_AMBULATORY_CARE_PROVIDER_SITE_OTHER): Admitting: *Deleted

## 2024-06-05 DIAGNOSIS — J309 Allergic rhinitis, unspecified: Secondary | ICD-10-CM

## 2024-06-12 ENCOUNTER — Ambulatory Visit (INDEPENDENT_AMBULATORY_CARE_PROVIDER_SITE_OTHER)

## 2024-06-12 DIAGNOSIS — J309 Allergic rhinitis, unspecified: Secondary | ICD-10-CM

## 2024-06-20 ENCOUNTER — Ambulatory Visit (INDEPENDENT_AMBULATORY_CARE_PROVIDER_SITE_OTHER): Payer: Self-pay

## 2024-06-20 DIAGNOSIS — J309 Allergic rhinitis, unspecified: Secondary | ICD-10-CM | POA: Diagnosis not present

## 2024-06-25 ENCOUNTER — Ambulatory Visit (INDEPENDENT_AMBULATORY_CARE_PROVIDER_SITE_OTHER): Payer: Self-pay | Admitting: *Deleted

## 2024-06-25 DIAGNOSIS — J309 Allergic rhinitis, unspecified: Secondary | ICD-10-CM

## 2024-07-03 ENCOUNTER — Ambulatory Visit (INDEPENDENT_AMBULATORY_CARE_PROVIDER_SITE_OTHER): Payer: Self-pay | Admitting: *Deleted

## 2024-07-03 DIAGNOSIS — J309 Allergic rhinitis, unspecified: Secondary | ICD-10-CM | POA: Diagnosis not present

## 2024-07-10 ENCOUNTER — Ambulatory Visit (INDEPENDENT_AMBULATORY_CARE_PROVIDER_SITE_OTHER): Payer: Self-pay

## 2024-07-10 DIAGNOSIS — J309 Allergic rhinitis, unspecified: Secondary | ICD-10-CM | POA: Diagnosis not present

## 2024-07-16 ENCOUNTER — Ambulatory Visit (INDEPENDENT_AMBULATORY_CARE_PROVIDER_SITE_OTHER): Payer: Self-pay | Admitting: *Deleted

## 2024-07-16 DIAGNOSIS — J309 Allergic rhinitis, unspecified: Secondary | ICD-10-CM | POA: Diagnosis not present

## 2024-07-17 ENCOUNTER — Telehealth: Payer: Self-pay | Admitting: *Deleted

## 2024-07-17 MED ORDER — EPINEPHRINE 0.3 MG/0.3ML IJ SOAJ: INTRAMUSCULAR | 3 refills | Status: AC

## 2024-07-17 NOTE — Telephone Encounter (Signed)
 Amy [name] called to state that she had a pretty bad allergic reaction approximately 40 minutes after her allergy  injections yesterday. She took Zyrtec  10mg  before her injections like she always does. She developed hives all over, throat tightness, and ear swelling. She took benadryl and used her Epipen . Her symptoms quickly went away after using the Epi. Please advise.   She received 0.25 Red Vial (sch A building up) - Has been on injections since April 2024.

## 2024-07-18 NOTE — Telephone Encounter (Signed)
 Informed of Dr. Rowan message. She did say that she is working through a gluten sensitivity with her GI doctor. She usually avoids it completely but for 1 week she has had to add it back in her diet so they can run a celiac panel.

## 2024-07-23 ENCOUNTER — Encounter: Payer: Self-pay | Admitting: Allergy and Immunology

## 2024-07-23 NOTE — Telephone Encounter (Signed)
 Called patient. She is scheduled to see Dr. Maurilio on August 12th in [place]. Patient aware of change in location.

## 2024-07-23 NOTE — Telephone Encounter (Signed)
 Amy [name] is due for her shot this week and wanted to see if you thought she should get it or not due to reaction last week.  She is still adding in gluten for her upcoming celiac test and is not sure if the addition of the gluten might have contributed to her reaction. Please advise.

## 2024-07-24 NOTE — Telephone Encounter (Signed)
 Pt informed and okay with this plan.

## 2024-07-25 ENCOUNTER — Ambulatory Visit: Payer: Managed Care, Other (non HMO) | Admitting: Allergy and Immunology

## 2024-07-31 ENCOUNTER — Ambulatory Visit (INDEPENDENT_AMBULATORY_CARE_PROVIDER_SITE_OTHER): Admitting: Allergy and Immunology

## 2024-07-31 VITALS — BP 110/68 | HR 80 | Temp 98.1°F | Resp 14 | Ht 60.24 in | Wt 139.9 lb

## 2024-07-31 DIAGNOSIS — J3089 Other allergic rhinitis: Secondary | ICD-10-CM | POA: Diagnosis not present

## 2024-07-31 DIAGNOSIS — J301 Allergic rhinitis due to pollen: Secondary | ICD-10-CM

## 2024-07-31 DIAGNOSIS — J4540 Moderate persistent asthma, uncomplicated: Secondary | ICD-10-CM | POA: Diagnosis not present

## 2024-07-31 DIAGNOSIS — T781XXD Other adverse food reactions, not elsewhere classified, subsequent encounter: Secondary | ICD-10-CM | POA: Diagnosis not present

## 2024-07-31 DIAGNOSIS — K219 Gastro-esophageal reflux disease without esophagitis: Secondary | ICD-10-CM

## 2024-07-31 MED ORDER — FAMOTIDINE 40 MG PO TABS: 40.0000 mg | ORAL_TABLET | Freq: Every evening | ORAL | 1 refills | Status: AC

## 2024-07-31 MED ORDER — OMEPRAZOLE 40 MG PO CPDR: 40.0000 mg | DELAYED_RELEASE_CAPSULE | Freq: Two times a day (BID) | ORAL | 1 refills | Status: AC

## 2024-07-31 MED ORDER — AIRSUPRA 90-80 MCG/ACT IN AERO: 2.0000 | INHALATION_SPRAY | RESPIRATORY_TRACT | 1 refills | Status: AC | PRN

## 2024-07-31 MED ORDER — MONTELUKAST SODIUM 10 MG PO TABS: 10.0000 mg | ORAL_TABLET | Freq: Every evening | ORAL | 1 refills | Status: AC

## 2024-07-31 MED ORDER — OLOPATADINE HCL 0.2 % OP SOLN: 1.0000 [drp] | OPHTHALMIC | 1 refills | Status: AC

## 2024-07-31 MED ORDER — BUDESONIDE-FORMOTEROL FUMARATE 160-4.5 MCG/ACT IN AERO: 2.0000 | INHALATION_SPRAY | Freq: Two times a day (BID) | RESPIRATORY_TRACT | 1 refills | Status: DC

## 2024-07-31 MED ORDER — TRIAMCINOLONE ACETONIDE 55 MCG/ACT NA AERO: 2.0000 | INHALATION_SPRAY | Freq: Every day | NASAL | 1 refills | Status: AC

## 2024-07-31 NOTE — Patient Instructions (Addendum)
  1.  Allergen avoidance measures -dust mite, cat, dog, other mammals, pollens, foods that give rise to oral reaction  2.  Continue to treat and prevent inflammation:  A. Symbicort  160 - 2 inhalation 1-2 times per day  B. OTC Nasacort  - 1 spray each nostril 1-7 times per week C. Montelukast  10 mg - 1 tablet 1 time per day D. Immunotherapy  3.  Continue to treat and prevent reflux/LPR/chest pain:  A. Minimize caffeine consumption B. Omeprazole  40 mg - 2 times per day C. START famotidine  40 mg - 1 tablet in evening  4.  If needed:  A. Airsupra  - 2 inhalations every 4-6 hours  B. Cetirizine  10 mg - 1 tablet 1-2 times per day C. Pataday  - 1 drop each eye 1 time per day D. Auvi-Q  0.3, benadryl, MD/ER evaluation for allergic reaction  5. Further evaluation for throat issue???  6. Return to clinic in 6 months or earlier if problem  7. Influenza = Tamiflu. Covid = Paxlovid

## 2024-07-31 NOTE — Progress Notes (Signed)
 [place] [facility] [place] - [place] [facility] [place]   Follow-up Note  Referring Provider: No ref. provider found Primary Provider: Patient, No Pcp Per Date of Office Visit: 07/31/2024  Subjective:   Amy [name] (DOB: 13-Feb-2001) is a 23 y.o. female who [facility] in re-evaluation of the following:  HPI: Amy [name] returns to this clinic in evaluation of asthma, allergic rhinitis, oral allergy  syndrome, anosmia, LPR.  I last saw her in this clinic 25 January 2024.  Overall she believes that her respiratory tract is doing very well on her current plan of using anti-inflammatory agents and immunotherapy.  She is using her Symbicort  mostly once a day and occasionally uses some Nasacort  and always uses her montelukast  and currently her immunotherapy is every week.  She has not required a systemic steroid to treat an exacerbation of asthma or an antibiotic to treat an episode of sinusitis.  She can exert herself with no problem and rarely uses a short acting bronchodilator.  But she did have a reaction with her immunotherapy.  She had a systemic reaction requiring the administration of epinephrine .  It is interesting to note that this occurred when she reintroduced gluten to her diet as directed by her GI doctor in anticipation of obtaining a diagnostic test for possible celiac disease.  She had been gluten-free for a prolonged period in time and it was only 5 days after using gluten that she had her immunotherapy reaction.  She thinks her reflux is doing well.  She is having a problem though with saliva getting stuck or piling up in her throat.  And when this piles up in her throat she finds it difficult to swallow.  This does not happen with consuming food or drinking fluid it only happens with saliva.  She did discuss this with her GI doctor who felt that it was not an esophageal issue.  She has not been having any chest pain with her reflux  fortunately.  She has been using her proton pump inhibitor twice a day.  She remains away from consumption of fresh fruits and vegetables.  Allergies as of 07/31/2024       Reactions   Other Itching   ALL FRUIT AND VEGETABLES PER PT CAUSES ORAL ALLERGY  SYNDROME        Medication List    Airsupra  90-80 MCG/ACT Aero Generic drug: Albuterol -Budesonide  Inhale 2 puffs into the lungs every 4 (four) hours as needed.   cetirizine  10 MG tablet Commonly known as: ZyrTEC  Allergy  Take 1 tablet (10 mg total) by mouth at bedtime.   EPINEPHrine  0.3 mg/0.3 mL Soaj injection Commonly known as: Auvi-Q  Inject 0.3 mg into the muscle as needed for anaphylaxis. As directed for life-threatening allergic reactions   EPINEPHrine  0.3 mg/0.3 mL Soaj injection Commonly known as: EPI-PEN Use as directed for life threatening allergic reactions   Hyoscyamine Sulfate SL 0.125 MG Subl Place 0.125 mg under the tongue.   montelukast  10 MG tablet Commonly known as: SINGULAIR  Take 1 tablet (10 mg total) by mouth at bedtime.   nitrofurantoin  (macrocrystal-monohydrate) 100 MG capsule Commonly known as: MACROBID  Take 1 capsule (100 mg total) by mouth 2 (two) times daily.   omeprazole  40 MG capsule Commonly known as: PRILOSEC Take 1 capsule (40 mg total) by mouth in the morning and at bedtime.   promethazine  25 MG tablet Commonly known as: PHENERGAN  Take 0.5-1 tablets (12.5-25 mg total) by mouth every 6 (six) hours  as needed for nausea or vomiting.   sucralfate 1 g tablet Commonly known as: CARAFATE Take 1 g by mouth.   Symbicort  160-4.5 MCG/ACT inhaler Generic drug: budesonide -formoterol  Inhale two puffs one to two times daily to prevent cough or wheeze.  Rinse, gargle, and spit after use.    Past Medical History:  Diagnosis Date   Asthma    Broken arm    Headache    History of seasonal allergies    PER PT   Vaccine for human papilloma virus (HPV) types 6, 11, 16, and 18 administered      Past Surgical History:  Procedure Laterality Date   ESOPHAGOGASTRODUODENOSCOPY (EGD) WITH PROPOFOL  N/A 02/02/2023   Procedure: ESOPHAGOGASTRODUODENOSCOPY (EGD) WITH PROPOFOL ;  Surgeon: Unk Corinn Skiff, MD;  Location: [facility] ENDOSCOPY;  Service: Gastroenterology;  Laterality: N/A;   LAPAROSCOPY N/A 07/22/2020   Procedure: LAPAROSCOPY DIAGNOSTIC;  Surgeon: Lake Read, MD;  Location: [facility] ORS;  Service: Gynecology;  Laterality: N/A;   OTHER SURGICAL HISTORY Right 10/2011   Right thumb   UPPER GI ENDOSCOPY  01/2023    Review of systems negative except as noted in HPI / PMHx or noted below:  Review of Systems  Constitutional: Negative.   HENT: Negative.    Eyes: Negative.   Respiratory: Negative.    Cardiovascular: Negative.   Gastrointestinal: Negative.   Genitourinary: Negative.   Musculoskeletal: Negative.   Skin: Negative.   Neurological: Negative.   Endo/Heme/Allergies: Negative.   Psychiatric/Behavioral: Negative.       Objective:   Vitals:   07/31/24 1600  BP: 110/68  Pulse: 80  Resp: 14  Temp: 98.1 F (36.7 C)  SpO2: 99%   Height: 5' 0.24 (153 cm)  Weight: 139 lb 14.4 oz (63.5 kg)   Physical Exam Constitutional:      Appearance: She is not diaphoretic.  HENT:     Head: Normocephalic.     Right Ear: Tympanic membrane, ear canal and external ear normal.     Left Ear: Tympanic membrane, ear canal and external ear normal.     Nose: Nose normal. No mucosal edema or rhinorrhea.     Mouth/Throat:     Pharynx: Uvula midline. No oropharyngeal exudate.  Eyes:     Conjunctiva/sclera: Conjunctivae normal.  Neck:     Thyroid: No thyromegaly.     Trachea: Trachea normal. No tracheal tenderness or tracheal deviation.  Cardiovascular:     Rate and Rhythm: Normal rate and regular rhythm.     Heart sounds: Normal heart sounds, S1 normal and S2 normal. No murmur heard. Pulmonary:     Effort: No respiratory distress.     Breath sounds: Normal breath  sounds. No stridor. No wheezing or rales.  Lymphadenopathy:     Head:     Right side of head: No tonsillar adenopathy.     Left side of head: No tonsillar adenopathy.     Cervical: No cervical adenopathy.  Skin:    Findings: No erythema or rash.     Nails: There is no clubbing.  Neurological:     Mental Status: She is alert.     Diagnostics: none  Assessment and Plan:   1. Asthma, moderate persistent, well-controlled   2. Perennial allergic rhinitis   3. Seasonal allergic rhinitis due to pollen   4. Pollen-food allergy , subsequent encounter   5. LPRD (laryngopharyngeal reflux disease)    1.  Allergen avoidance measures -dust mite, cat, dog, other mammals, pollens, foods that give rise to oral  reaction  2.  Continue to treat and prevent inflammation:  A. Symbicort  160 - 2 inhalation 1-2 times per day  B. OTC Nasacort  - 1 spray each nostril 1-7 times per week C. Montelukast  10 mg - 1 tablet 1 time per day D. Immunotherapy  3.  Continue to treat and prevent reflux/LPR/chest pain:  A. Minimize caffeine consumption B. Omeprazole  40 mg - 2 times per day C. START famotidine  40 mg - 1 tablet in evening  4.  If needed:  A. Airsupra  - 2 inhalations every 4-6 hours  B. Cetirizine  10 mg - 1 tablet 1-2 times per day C. Pataday  - 1 drop each eye 1 time per day D. Auvi-Q  0.3, benadryl, MD/ER evaluation for allergic reaction  5. Further evaluation for throat issue???  6. Return to clinic in 6 months or earlier if problem  7. Influenza = Tamiflu. Covid = Paxlovid  Umaima appears to be doing pretty well regarding her very significant atopic disease contributing to airway inflammation and also oral allergy  syndrome.  She did have a systemic reaction to her immunotherapy in the context of reinitiating consumption of gluten.  She has had her gluten test performed and now she is once again gluten free and we will now restart her immunotherapy.  She has this issue with saliva getting  piled up in her throat and sometimes making it difficult to swallow though she can swallow food and liquid drink with no problem.  Will assume that maybe she is having some more problems with LPR and started her on famotidine  in the evening and she can contact me within a few weeks to see if this takes care of this issue.  If not, she would definitely require further evaluation for this throat issue.  If she does well we will see her back in this clinic in 6 months or earlier if there is a problem.  Camellia Denis, MD Allergy  / Immunology [facility]

## 2024-08-01 ENCOUNTER — Encounter: Payer: Self-pay | Admitting: Allergy and Immunology

## 2024-08-21 ENCOUNTER — Ambulatory Visit (INDEPENDENT_AMBULATORY_CARE_PROVIDER_SITE_OTHER): Payer: Self-pay | Admitting: *Deleted

## 2024-08-21 DIAGNOSIS — J309 Allergic rhinitis, unspecified: Secondary | ICD-10-CM | POA: Diagnosis not present

## 2024-08-27 ENCOUNTER — Ambulatory Visit: Admitting: Allergy and Immunology

## 2024-09-04 ENCOUNTER — Ambulatory Visit (INDEPENDENT_AMBULATORY_CARE_PROVIDER_SITE_OTHER): Payer: Self-pay | Admitting: *Deleted

## 2024-09-04 DIAGNOSIS — J309 Allergic rhinitis, unspecified: Secondary | ICD-10-CM | POA: Diagnosis not present

## 2024-11-28 ENCOUNTER — Ambulatory Visit

## 2024-12-17 NOTE — Progress Notes (Signed)
 Chief Complaint:   HPI: Amy [name] is a 23 y.o. G14P0000 female here for  Chief Complaint  Patient presents with   Amenorrhea  .  She is an established patient but new to me who presents today for a new problem.   Chief Complaint: amenorrhea Onset: 8 weeks Location: uterus/vagina Duration: no period since December Characteristics: no menstrual flow as expected Severity: n/a Aggravating factors: none Relieving factors: none Treatment: none Associated signs and symptoms: nausea, fatigue Context: Larisha had a period 10/18/24 but she hasn't had one since. She usually has regular cycles, every 28-30 days. She was not using any kind of contraception. This was an unplanned pregnancy.   Patient's last menstrual period was 10/18/2024 (exact date).    She is currently experiencing nausea and fatigue and denies bleeding, contractions, cramping or leaking.    Patient Active Problem List  Diagnosis   Allergic rhinitis   Frequent episodic tension-type headache   Generalized anxiety disorder   Episodic tension-type headache, not intractable   Pollen-food allergy  syndrome, initial encounter   Mild intermittent asthma without complication (HHS-HCC)   Chronic pelvic pain in female   Dyspareunia in female   Endometriosis determined by laparoscopy   Family history of breast cancer   LLQ pain   Carpal tunnel syndrome of right wrist   Gastric erosion   Pain in right hand   Cough   Dyspepsia     The following portions of the patient's history were reviewed and updated as appropriate: Past Medical History:  has a past medical history of Allergy , Anxiety, Asthma, unspecified asthma severity, unspecified whether complicated, unspecified whether persistent (HHS-HCC), Chronic pelvic pain in female, COVID-19 (07/2020), Dyspareunia in female, Endometriosis, Episodic tension-type headache, not intractable, Family history of breast cancer, and Frequent episodic tension-type  headache. Past Surgical History:  has a past surgical history that includes Fracture surgery; Lengthening/Shortening Flexor/Extensor Tendon Forearm/Wrist (Right); laparoscopy diagnostic (07/2020); and Pelvic laparoscopy (07/21/20). Family History: family history includes ADD / ADHD in her maternal uncle; Alcohol abuse in her paternal grandfather; Allergic rhinitis in her maternal grandmother and mother; Anxiety in her maternal grandmother and mother; Asthma in her maternal grandmother and mother; Breast cancer in her maternal great-grandmother and paternal grandmother; Colon cancer in her maternal great-grandmother; Depression in her maternal grandmother and paternal grandmother; Diabetes in her maternal grandmother and mother; Diabetes type II in her maternal grandmother and sister; High blood pressure (Hypertension) in her maternal grandfather and maternal grandmother; Hyperlipidemia (Elevated cholesterol) in her maternal grandfather; Migraines in her maternal grandmother and maternal uncle; Myocardial Infarction (Heart attack) in her maternal grandmother; Stomach cancer in her maternal great-grandmother; Thyroid disease in her paternal grandmother. Social History:  reports that she has never smoked. She has never used smokeless tobacco. She reports current alcohol use of about 2.0 standard drinks of alcohol per week. She reports that she does not use drugs. OB/GYN History:  OB History     Gravida  1   Para  0   Term  0   Preterm  0   AB  0   Living  0      SAB  0   IAB  0   Ectopic  0   Molar  0   Multiple  0   Live Births  0          Allergies: is allergic to nutritional supplements and other. Current Meds:  Current Outpatient Medications:    AIRSUPRA  90-80 mcg/actuation HFAA, INHALE 2 PUFFS INTO  LUNGS EVERY 4 HOURS AS NEEDED, Disp: , Rfl:    budesonide -formoteroL  (SYMBICORT ) 160-4.5 mcg/actuation inhaler, Inhale 2 inhalations into the lungs once daily, Disp: , Rfl:     cetirizine  (ZYRTEC ) 10 MG tablet, Take 1 tablet (10 mg total) by mouth once daily Reported on 01/07/2016, Disp: 30 tablet, Rfl: 1   EPINEPHrine  (EPIPEN ) 0.3 mg/0.3 mL auto-injector, Inject 0.3 mg into the muscle as needed for Anaphylaxis, Disp: , Rfl:    famotidine  (PEPCID ) 40 MG tablet, Take 40 mg by mouth at bedtime, Disp: , Rfl:    montelukast  (SINGULAIR ) 10 mg tablet, TAKE 1 TABLET(10 MG) BY MOUTH EVERY NIGHT, Disp: 90 tablet, Rfl: 1   olopatadine  (PATADAY ) 0.2 % ophthalmic solution, Apply 1 drop to eye, Disp: , Rfl:    omeprazole  (PRILOSEC) 40 MG DR capsule, Take 40 mg by mouth 2 (two) times daily, Disp: , Rfl:    promethazine  (PHENERGAN ) 25 MG tablet, Take 25 mg by mouth every 6 (six) hours as needed, Disp: , Rfl:    SPACE CHAMBER WITH MEDIUM MASK Spcr, as directed, Disp: , Rfl:    sucralfate (CARAFATE) 1 gram tablet, Take 1 tablet (1 g total) by mouth 2 (two) times daily before meals, Disp: 60 tablet, Rfl: 3   triamcinolone  acetonide (NASACORT  ALLERGY  NASAL), Place into one nostril, Disp: , Rfl:    hyoscyamine (LEVSIN/SL) 0.125 mg SL tablet, Place 1 tablet (0.125 mg total) under the tongue every 6 (six) hours as needed (Patient not taking: Reported on 12/17/2024), Disp: 30 tablet, Rfl: 1   Review of Systems Pertinent positives and negatives per HPI, otherwise a 3-system review of systems was negative.     Exam:  Constitutional: Vitals:   12/17/24 1049  BP: 121/78  Pulse: 82   Body mass index is 28.9 kg/m.  Physical Exam:  General Appearance:    Well-developed, well-nourished, no acute distress, appears stated age  Lungs:     Respirations unlabored  Neuro: Psych:   Alert, oriented x3  Appropriate mood and insight, judgement intact   TVUS: IUP measuring [redacted]w[redacted]d c/w LMP FHR 170bpm Cervical length 3.8cm Bilateral ovaries WNL  Impression:   The encounter diagnosis was Amenorrhea.  Plan:   Firm LMP: She is [redacted]w[redacted]d based on LMP of 10/18/24, with an Estimated Date of  Delivery: 07/25/2025. Early ultrasound today demonstrates a normal appearing early pregnancy with size consistent with these dates.   Prenatal screening discussed:  - First trimester screening discussed and requested - Second trimester screening with quad screen  Pregnancy Risk Factors: - asthma, stopped her Singulair  since she found out she was pregnant, typically allergy -induced, severe asthma. Reviewed her asthma medications with her and advised her to continue them. Let us  know if having worsening asthma control in pregnancy or any hospitalizations. Will need growth US  in 3rd trimester. - allergies: severe allergies that trigger her asthma, stopped her allergy  shots, Xyzal controls her allergies really well, Xyzal is not a first-line allergy  medications, but because the allergies trigger her asthma, encouraged her to continue her Xyzal.  - anxiety, no medications - endometriosis - tension headaches, have improved since becoming pregnant, pre-pregnancy: tried not to take medications but takes Tylenol  PRN  -Risk factors discussed.   -Healthy dietary and lifestyle choices stressed -Recommended weight gain discussed.   -Exercise was encouraged.  -Continue taking prenatal vitamins.  -Medications reviewed for appropriateness during pregnancy.  -Vaginal bleeding precautions and warning s/s reviewed  The patient was counseled on prenatal screening/testing, expectations for prenatal appointments and  ultrasounds, the group practice setting with multiple ob providers who will be sharing in her prenatal care. The [facility] OBGYN Prenatal Booklet was also reviewed with the patient.  Requested Prescriptions    No prescriptions requested or ordered in this encounter    No orders of the defined types were placed in this encounter.   Return in about 4 weeks (around 01/14/2025) for NOB visit.  I personally performed the service, non-incident to. [facility])   Edsel Blush, CNM 12/17/2024

## 2024-12-25 ENCOUNTER — Telehealth: Payer: Self-pay | Admitting: Allergy and Immunology

## 2024-12-25 MED ORDER — BUDESONIDE-FORMOTEROL FUMARATE 160-4.5 MCG/ACT IN AERO: INHALATION_SPRAY | RESPIRATORY_TRACT | 5 refills | Status: AC

## 2024-12-25 NOTE — Telephone Encounter (Signed)
"  Refill sent  "

## 2024-12-25 NOTE — Telephone Encounter (Signed)
 Patient is requesting a refill on Symbicort  sent to [facility] on [address] in [place].

## 2025-01-02 ENCOUNTER — Other Ambulatory Visit (HOSPITAL_COMMUNITY): Payer: Self-pay

## 2025-01-02 ENCOUNTER — Telehealth: Payer: Self-pay

## 2025-01-02 NOTE — Telephone Encounter (Signed)
*  [facility]  Pharmacy Patient Advocate Encounter   Received notification from Fax that prior authorization for budesonide -Formoterol   is required/requested.   New insurance card is needed for test claim and processing, mychart message sent to patient for updated insurance card.   Key: [id number]

## 2025-01-05 ENCOUNTER — Emergency Department (HOSPITAL_COMMUNITY)
Admission: EM | Admit: 2025-01-05 | Discharge: 2025-01-06 | Disposition: A | Attending: Emergency Medicine | Admitting: Emergency Medicine

## 2025-01-05 ENCOUNTER — Other Ambulatory Visit: Payer: Self-pay

## 2025-01-05 ENCOUNTER — Encounter (HOSPITAL_COMMUNITY): Payer: Self-pay

## 2025-01-05 DIAGNOSIS — R682 Dry mouth, unspecified: Secondary | ICD-10-CM | POA: Diagnosis not present

## 2025-01-05 DIAGNOSIS — R1310 Dysphagia, unspecified: Secondary | ICD-10-CM | POA: Diagnosis not present

## 2025-01-05 DIAGNOSIS — O99891 Other specified diseases and conditions complicating pregnancy: Secondary | ICD-10-CM | POA: Insufficient documentation

## 2025-01-05 DIAGNOSIS — R0989 Other specified symptoms and signs involving the circulatory and respiratory systems: Secondary | ICD-10-CM | POA: Diagnosis not present

## 2025-01-05 LAB — BASIC METABOLIC PANEL WITH GFR
Anion gap: 9 (ref 5–15)
BUN: 6 mg/dL (ref 6–20)
CO2: 24 mmol/L (ref 22–32)
Calcium: 8.9 mg/dL (ref 8.9–10.3)
Chloride: 104 mmol/L (ref 98–111)
Creatinine, Ser: 0.54 mg/dL (ref 0.44–1.00)
GFR, Estimated: >60 mL/min
Glucose, Bld: 118 mg/dL — ABNORMAL HIGH (ref 70–99)
Potassium: 3.5 mmol/L (ref 3.5–5.1)
Sodium: 136 mmol/L (ref 135–145)

## 2025-01-05 LAB — CBC WITH DIFFERENTIAL/PLATELET
Abs Immature Granulocytes: 0.02 K/uL (ref 0.00–0.07)
Basophils Absolute: 0.1 K/uL (ref 0.0–0.1)
Basophils Relative: 1 %
Eosinophils Absolute: 0.1 K/uL (ref 0.0–0.5)
Eosinophils Relative: 1 %
HCT: 33.5 % — ABNORMAL LOW (ref 36.0–46.0)
Hemoglobin: 11.8 g/dL — ABNORMAL LOW (ref 12.0–15.0)
Immature Granulocytes: 0 %
Lymphocytes Relative: 26 %
Lymphs Abs: 2.4 K/uL (ref 0.7–4.0)
MCH: 29.5 pg (ref 26.0–34.0)
MCHC: 35.2 g/dL (ref 30.0–36.0)
MCV: 83.8 fL (ref 80.0–100.0)
Monocytes Absolute: 1 K/uL (ref 0.1–1.0)
Monocytes Relative: 11 %
Neutro Abs: 5.5 K/uL (ref 1.7–7.7)
Neutrophils Relative %: 61 %
Platelets: 212 K/uL (ref 150–400)
RBC: 4 MIL/uL (ref 3.87–5.11)
RDW: 12.2 % (ref 11.5–15.5)
WBC: 9.1 K/uL (ref 4.0–10.5)
nRBC: 0 % (ref 0.0–0.2)

## 2025-01-05 NOTE — ED Triage Notes (Signed)
 Difficulty swallowing for past month or so.

## 2025-01-05 NOTE — ED Provider Notes (Signed)
 " [facility] AT [facility] Provider Note   CSN: [id number] Arrival date & time: 01/05/25  2132     Patient presents with: Dysphagia   Amy [name] is a 24 y.o. female.  {Add pertinent medical, surgical, social history, OB history to YEP:67052} Patient to ED with difficulty swallowing. She describes a feeling of being unable to swallow her own saliva but is able to eat and drink without difficulty. No regurgitation or vomiting. No lip or tongue swelling. She is currently pregnant, G1P0, with no abdominal pain or pregnancy concerns. Symptoms of difficulty swallowing has been going on for the last 4-6 weeks.   The history is provided by the patient. No language interpreter was used.       Prior to Admission medications  Medication Sig Start Date End Date Taking? Authorizing Provider  budesonide -formoterol  (SYMBICORT ) 160-4.5 MCG/ACT inhaler 2 inhalation 1-2 times per day Patient taking differently: Inhale 2 puffs into the lungs daily as needed. 12/25/24  Yes Kozlow, Camellia PARAS, MD  cetirizine  (ZYRTEC  ALLERGY ) 10 MG tablet Take 1 tablet (10 mg total) by mouth at bedtime. Patient taking differently: Take 10 mg by mouth in the morning. 10/17/22 01/05/25 Yes Joesph Shaver Scales, PA-C  EPINEPHrine  0.3 mg/0.3 mL IJ SOAJ injection Use as directed for life threatening allergic reactions 07/17/24  Yes Kozlow, Camellia PARAS, MD  famotidine  (PEPCID ) 40 MG tablet Take 1 tablet (40 mg total) by mouth at bedtime. Patient taking differently: Take 40 mg by mouth daily as needed. 07/31/24  Yes Kozlow, Camellia PARAS, MD  montelukast  (SINGULAIR ) 10 MG tablet Take 1 tablet (10 mg total) by mouth at bedtime. Patient taking differently: Take 10 mg by mouth daily. 07/31/24  Yes Kozlow, Camellia PARAS, MD  omeprazole  (PRILOSEC) 40 MG capsule Take 1 capsule (40 mg total) by mouth in the morning and at bedtime. Patient taking differently: Take 40 mg by mouth daily. 07/31/24  Yes Kozlow, Camellia PARAS, MD   promethazine  (PHENERGAN ) 25 MG tablet Take 0.5-1 tablets (12.5-25 mg total) by mouth every 6 (six) hours as needed for nausea or vomiting. 02/23/24  Yes Christopher Savannah, PA-C  triamcinolone  (NASACORT ) 55 MCG/ACT AERO nasal inhaler Place 2 sprays into the nose daily. Patient taking differently: Place 2 sprays into the nose daily as needed. 07/31/24  Yes Kozlow, Camellia PARAS, MD  Albuterol -Budesonide  (AIRSUPRA ) 90-80 MCG/ACT AERO Inhale 2 puffs into the lungs every 4 (four) hours as needed. Patient not taking: Reported on 01/05/2025 07/31/24   Kozlow, Eric J, MD  EPINEPHrine  (AUVI-Q ) 0.3 mg/0.3 mL IJ SOAJ injection Inject 0.3 mg into the muscle as needed for anaphylaxis. As directed for life-threatening allergic reactions Patient not taking: Reported on 01/05/2025 07/23/21   Kozlow, Eric J, MD  nitrofurantoin , macrocrystal-monohydrate, (MACROBID ) 100 MG capsule Take 1 capsule (100 mg total) by mouth 2 (two) times daily. Patient not taking: Reported on 01/05/2025 05/12/24   Teresa Shelba JONELLE, NP  Olopatadine  HCl (PATADAY ) 0.2 % SOLN Place 1 drop into both eyes 1 day or 1 dose. Patient not taking: Reported on 01/05/2025 07/31/24   Kozlow, Eric J, MD  SYMBICORT  160-4.5 MCG/ACT inhaler Inhale two puffs one to two times daily to prevent cough or wheeze.  Rinse, gargle, and spit after use. Patient not taking: Reported on 01/05/2025 02/10/23   Kozlow, Eric J, MD    Allergies: Other    Review of Systems  Updated Vital Signs BP 127/73 (BP Location: Left Arm)   Pulse (!) 103  Temp 98.8 F (37.1 C) (Oral)   Resp 18   SpO2 99%   Physical Exam  (all labs ordered are listed, but only abnormal results are displayed) Labs Reviewed  CBC WITH DIFFERENTIAL/PLATELET  BASIC METABOLIC PANEL WITH GFR    EKG: None  Radiology: No results found.  {Document cardiac monitor, telemetry assessment procedure when appropriate:32947} Procedures   Medications Ordered in the ED - No data to display    {Click here for ABCD2,  HEART and other calculators REFRESH Note before signing:1}                              Medical Decision Making Amount and/or Complexity of Data Reviewed Labs: ordered.   ***  {Document critical care time when appropriate  Document review of labs and clinical decision tools ie CHADS2VASC2, etc  Document your independent review of radiology images and any outside records  Document your discussion with family members, caretakers and with consultants  Document social determinants of health affecting pt's care  Document your decision making why or why not admission, treatments were needed:32947:::1}   Final diagnoses:  None    ED Discharge Orders     None        "

## 2025-01-06 NOTE — Discharge Instructions (Signed)
 As we discussed, coating the throat with lozenges or honey may provide temporary relief.   It is recommended that follow up with Ear, Nose and Throat specialist is appropriate. Please call Dr. Masciello to schedule an appointment.

## 2025-01-21 NOTE — Telephone Encounter (Signed)
 Closing request due to no response from patient.

## 2025-01-28 ENCOUNTER — Ambulatory Visit: Admitting: Allergy and Immunology

## 2025-01-29 ENCOUNTER — Ambulatory Visit: Admitting: Family
